# Patient Record
Sex: Male | Born: 1975
Health system: Southern US, Community
[De-identification: ages and names within clinical notes are randomized; demographics above are authoritative.]

## PROBLEM LIST (undated history)

## (undated) DIAGNOSIS — G4730 Sleep apnea, unspecified: Secondary | ICD-10-CM

## (undated) DIAGNOSIS — K469 Unspecified abdominal hernia without obstruction or gangrene: Secondary | ICD-10-CM

## (undated) DIAGNOSIS — R42 Dizziness and giddiness: Secondary | ICD-10-CM

## (undated) DIAGNOSIS — G43909 Migraine, unspecified, not intractable, without status migrainosus: Secondary | ICD-10-CM

## (undated) DIAGNOSIS — K5792 Diverticulitis of intestine, part unspecified, without perforation or abscess without bleeding: Secondary | ICD-10-CM

## (undated) DIAGNOSIS — Z86718 Personal history of other venous thrombosis and embolism: Secondary | ICD-10-CM

## (undated) DIAGNOSIS — K219 Gastro-esophageal reflux disease without esophagitis: Secondary | ICD-10-CM

## (undated) DIAGNOSIS — J45909 Unspecified asthma, uncomplicated: Secondary | ICD-10-CM

## (undated) DIAGNOSIS — M1990 Unspecified osteoarthritis, unspecified site: Secondary | ICD-10-CM

## (undated) HISTORY — DX: Gastro-esophageal reflux disease without esophagitis: K21.9

## (undated) HISTORY — DX: Personal history of other venous thrombosis and embolism: Z86.718

## (undated) HISTORY — DX: Migraine, unspecified, not intractable, without status migrainosus: G43.909

## (undated) HISTORY — DX: Unspecified asthma, uncomplicated: J45.909

## (undated) HISTORY — DX: Dizziness and giddiness: R42

---

## 2010-11-12 HISTORY — PX: KNEE ARTHROSCOPY: SHX127

## 2016-02-28 DIAGNOSIS — K219 Gastro-esophageal reflux disease without esophagitis: Secondary | ICD-10-CM | POA: Insufficient documentation

## 2016-02-28 DIAGNOSIS — K5909 Other constipation: Secondary | ICD-10-CM | POA: Insufficient documentation

## 2016-02-28 DIAGNOSIS — M170 Bilateral primary osteoarthritis of knee: Secondary | ICD-10-CM | POA: Insufficient documentation

## 2016-02-28 DIAGNOSIS — Z6841 Body Mass Index (BMI) 40.0 and over, adult: Secondary | ICD-10-CM

## 2017-01-26 DIAGNOSIS — K5732 Diverticulitis of large intestine without perforation or abscess without bleeding: Secondary | ICD-10-CM | POA: Diagnosis not present

## 2017-06-30 DIAGNOSIS — M25569 Pain in unspecified knee: Secondary | ICD-10-CM | POA: Diagnosis not present

## 2017-09-15 DIAGNOSIS — K5732 Diverticulitis of large intestine without perforation or abscess without bleeding: Secondary | ICD-10-CM | POA: Diagnosis not present

## 2017-11-28 ENCOUNTER — Observation Stay (HOSPITAL_COMMUNITY)
Admission: EM | Admit: 2017-11-28 | Discharge: 2017-11-30 | Disposition: A | Discharge status: Home / Self Care | Payer: BLUE CROSS/BLUE SHIELD | Attending: General Surgery | Admitting: General Surgery

## 2017-11-28 ENCOUNTER — Encounter (HOSPITAL_COMMUNITY): Payer: Self-pay | Admitting: Emergency Medicine

## 2017-11-28 ENCOUNTER — Other Ambulatory Visit: Payer: Self-pay

## 2017-11-28 DIAGNOSIS — R1110 Vomiting, unspecified: Secondary | ICD-10-CM | POA: Diagnosis not present

## 2017-11-28 DIAGNOSIS — Z6839 Body mass index (BMI) 39.0-39.9, adult: Secondary | ICD-10-CM | POA: Insufficient documentation

## 2017-11-28 DIAGNOSIS — K460 Unspecified abdominal hernia with obstruction, without gangrene: Secondary | ICD-10-CM | POA: Diagnosis not present

## 2017-11-28 DIAGNOSIS — K429 Umbilical hernia without obstruction or gangrene: Secondary | ICD-10-CM | POA: Diagnosis present

## 2017-11-28 DIAGNOSIS — Z91030 Bee allergy status: Secondary | ICD-10-CM | POA: Insufficient documentation

## 2017-11-28 DIAGNOSIS — M170 Bilateral primary osteoarthritis of knee: Secondary | ICD-10-CM | POA: Insufficient documentation

## 2017-11-28 DIAGNOSIS — E669 Obesity, unspecified: Secondary | ICD-10-CM | POA: Insufficient documentation

## 2017-11-28 DIAGNOSIS — Z9989 Dependence on other enabling machines and devices: Secondary | ICD-10-CM | POA: Insufficient documentation

## 2017-11-28 DIAGNOSIS — K420 Umbilical hernia with obstruction, without gangrene: Principal | ICD-10-CM | POA: Diagnosis present

## 2017-11-28 DIAGNOSIS — Z79899 Other long term (current) drug therapy: Secondary | ICD-10-CM | POA: Diagnosis not present

## 2017-11-28 DIAGNOSIS — R1033 Periumbilical pain: Secondary | ICD-10-CM | POA: Diagnosis not present

## 2017-11-28 DIAGNOSIS — K469 Unspecified abdominal hernia without obstruction or gangrene: Secondary | ICD-10-CM

## 2017-11-28 DIAGNOSIS — Z8719 Personal history of other diseases of the digestive system: Secondary | ICD-10-CM | POA: Diagnosis not present

## 2017-11-28 DIAGNOSIS — G4730 Sleep apnea, unspecified: Secondary | ICD-10-CM | POA: Diagnosis not present

## 2017-11-28 HISTORY — DX: Unspecified osteoarthritis, unspecified site: M19.90

## 2017-11-28 HISTORY — DX: Diverticulitis of intestine, part unspecified, without perforation or abscess without bleeding: K57.92

## 2017-11-28 HISTORY — DX: Unspecified abdominal hernia without obstruction or gangrene: K46.9

## 2017-11-28 HISTORY — DX: Sleep apnea, unspecified: G47.30

## 2017-11-28 LAB — COMPREHENSIVE METABOLIC PANEL
ALBUMIN: 4 g/dL (ref 3.5–5.0)
ALK PHOS: 73 U/L (ref 38–126)
ALT: 25 U/L (ref 17–63)
ANION GAP: 9 (ref 5–15)
AST: 21 U/L (ref 15–41)
BUN: 10 mg/dL (ref 6–20)
CALCIUM: 9 mg/dL (ref 8.9–10.3)
CO2: 25 mmol/L (ref 22–32)
Chloride: 104 mmol/L (ref 101–111)
Creatinine, Ser: 0.93 mg/dL (ref 0.61–1.24)
GFR calc non Af Amer: >60 mL/min (ref >60)
GLUCOSE: 110 mg/dL — AB (ref 65–99)
POTASSIUM: 4.2 mmol/L (ref 3.5–5.1)
SODIUM: 138 mmol/L (ref 135–145)
Total Bilirubin: 0.5 mg/dL (ref 0.3–1.2)
Total Protein: 7.3 g/dL (ref 6.5–8.1)

## 2017-11-28 LAB — CBC
HEMATOCRIT: 43.7 % (ref 39.0–52.0)
HEMOGLOBIN: 14.2 g/dL (ref 13.0–17.0)
MCH: 27.3 pg (ref 26.0–34.0)
MCHC: 32.5 g/dL (ref 30.0–36.0)
MCV: 83.9 fL (ref 78.0–100.0)
Platelets: 290 10*3/uL (ref 150–400)
RBC: 5.21 MIL/uL (ref 4.22–5.81)
RDW: 13.4 % (ref 11.5–15.5)
WBC: 8.5 10*3/uL (ref 4.0–10.5)

## 2017-11-28 LAB — URINALYSIS, ROUTINE W REFLEX MICROSCOPIC
Bilirubin Urine: NEGATIVE
Glucose, UA: NEGATIVE mg/dL
Hgb urine dipstick: NEGATIVE
Ketones, ur: NEGATIVE mg/dL
Leukocytes, UA: NEGATIVE
Nitrite: NEGATIVE
PH: 5 (ref 5.0–8.0)
Protein, ur: NEGATIVE mg/dL
SPECIFIC GRAVITY, URINE: 1.013 (ref 1.005–1.030)

## 2017-11-28 LAB — LIPASE, BLOOD: Lipase: 31 U/L (ref 11–51)

## 2017-11-28 MED ORDER — MORPHINE SULFATE (PF) 4 MG/ML IV SOLN
4.0000 mg | Freq: Once | INTRAVENOUS | Status: AC
  Administered 2017-11-29: 4 mg via INTRAVENOUS
  Filled 2017-11-28: qty 1

## 2017-11-28 MED ORDER — SODIUM CHLORIDE 0.9 % IV BOLUS (SEPSIS)
1000.0000 mL | Freq: Once | INTRAVENOUS | Status: AC
  Administered 2017-11-29: 1000 mL via INTRAVENOUS

## 2017-11-28 NOTE — ED Triage Notes (Signed)
Pt reports hx of abd hernia that he has had for a while, states he normally pushes hernia back in but recently is has popped out and he was not able to push it back in. Pt reports nausea and vomiting since the beginning of the week.

## 2017-11-29 ENCOUNTER — Emergency Department (HOSPITAL_COMMUNITY): Payer: BLUE CROSS/BLUE SHIELD | Admitting: Certified Registered"

## 2017-11-29 ENCOUNTER — Emergency Department (HOSPITAL_COMMUNITY): Payer: BLUE CROSS/BLUE SHIELD

## 2017-11-29 ENCOUNTER — Encounter (HOSPITAL_COMMUNITY): Payer: Self-pay | Admitting: Certified Registered"

## 2017-11-29 ENCOUNTER — Encounter (HOSPITAL_COMMUNITY)
Admission: EM | Disposition: A | Discharge status: Home / Self Care | Payer: Self-pay | Source: Home / Self Care | Attending: Emergency Medicine

## 2017-11-29 DIAGNOSIS — K420 Umbilical hernia with obstruction, without gangrene: Secondary | ICD-10-CM | POA: Diagnosis not present

## 2017-11-29 DIAGNOSIS — M1990 Unspecified osteoarthritis, unspecified site: Secondary | ICD-10-CM | POA: Diagnosis not present

## 2017-11-29 DIAGNOSIS — R1110 Vomiting, unspecified: Secondary | ICD-10-CM | POA: Diagnosis not present

## 2017-11-29 DIAGNOSIS — K429 Umbilical hernia without obstruction or gangrene: Secondary | ICD-10-CM | POA: Diagnosis present

## 2017-11-29 HISTORY — PX: UMBILICAL HERNIA REPAIR: SHX196

## 2017-11-29 LAB — CBC
HEMATOCRIT: 44.2 % (ref 39.0–52.0)
Hemoglobin: 14.3 g/dL (ref 13.0–17.0)
MCH: 27.5 pg (ref 26.0–34.0)
MCHC: 32.4 g/dL (ref 30.0–36.0)
MCV: 85 fL (ref 78.0–100.0)
Platelets: 279 10*3/uL (ref 150–400)
RBC: 5.2 MIL/uL (ref 4.22–5.81)
RDW: 13.7 % (ref 11.5–15.5)
WBC: 13.2 10*3/uL — ABNORMAL HIGH (ref 4.0–10.5)

## 2017-11-29 LAB — I-STAT CG4 LACTIC ACID, ED
Lactic Acid, Venous: 0.97 mmol/L (ref 0.5–1.9)
Lactic Acid, Venous: 1.18 mmol/L (ref 0.5–1.9)

## 2017-11-29 LAB — CREATININE, SERUM
Creatinine, Ser: 0.94 mg/dL (ref 0.61–1.24)
GFR calc Af Amer: >60 mL/min (ref >60)
GFR calc non Af Amer: >60 mL/min (ref >60)

## 2017-11-29 SURGERY — REPAIR, HERNIA, UMBILICAL, ADULT
Anesthesia: General | Site: Abdomen

## 2017-11-29 MED ORDER — DOCUSATE SODIUM 100 MG PO CAPS
100.0000 mg | ORAL_CAPSULE | Freq: Two times a day (BID) | ORAL | Status: DC
  Administered 2017-11-29 – 2017-11-30 (×3): 100 mg via ORAL
  Filled 2017-11-29 (×3): qty 1

## 2017-11-29 MED ORDER — LACTATED RINGERS IV SOLN
INTRAVENOUS | Status: DC | PRN
  Administered 2017-11-29 (×2): via INTRAVENOUS

## 2017-11-29 MED ORDER — LIDOCAINE 2% (20 MG/ML) 5 ML SYRINGE
INTRAMUSCULAR | Status: AC
  Filled 2017-11-29: qty 5

## 2017-11-29 MED ORDER — OXYCODONE HCL 5 MG PO TABS
5.0000 mg | ORAL_TABLET | Freq: Once | ORAL | Status: AC | PRN
  Administered 2017-11-29: 5 mg via ORAL

## 2017-11-29 MED ORDER — POLYETHYLENE GLYCOL 3350 17 G PO PACK: 17.0000 g | PACK | Freq: Every day | ORAL | Status: DC | PRN

## 2017-11-29 MED ORDER — SIMETHICONE 80 MG PO CHEW: 40.0000 mg | CHEWABLE_TABLET | Freq: Four times a day (QID) | ORAL | Status: DC | PRN

## 2017-11-29 MED ORDER — GABAPENTIN 300 MG PO CAPS
300.0000 mg | ORAL_CAPSULE | Freq: Two times a day (BID) | ORAL | Status: DC
  Administered 2017-11-29 – 2017-11-30 (×3): 300 mg via ORAL
  Filled 2017-11-29 (×3): qty 1

## 2017-11-29 MED ORDER — MORPHINE SULFATE (PF) 4 MG/ML IV SOLN
2.0000 mg | INTRAVENOUS | Status: DC | PRN
  Administered 2017-11-29 – 2017-11-30 (×2): 2 mg via INTRAVENOUS
  Filled 2017-11-29 (×2): qty 1

## 2017-11-29 MED ORDER — HYDROCODONE-ACETAMINOPHEN 5-325 MG PO TABS: 1.0000 | ORAL_TABLET | ORAL | Status: DC | PRN

## 2017-11-29 MED ORDER — ACETAMINOPHEN 325 MG PO TABS
650.0000 mg | ORAL_TABLET | Freq: Four times a day (QID) | ORAL | Status: DC
  Administered 2017-11-29 – 2017-11-30 (×4): 650 mg via ORAL
  Filled 2017-11-29 (×5): qty 2

## 2017-11-29 MED ORDER — LIDOCAINE HCL (CARDIAC) 20 MG/ML IV SOLN
INTRAVENOUS | Status: DC | PRN
  Administered 2017-11-29: 100 mg via INTRATRACHEAL

## 2017-11-29 MED ORDER — PROPOFOL 10 MG/ML IV BOLUS
INTRAVENOUS | Status: DC | PRN
  Administered 2017-11-29: 30 mg via INTRAVENOUS
  Administered 2017-11-29: 10 mg via INTRAVENOUS
  Administered 2017-11-29: 200 mg via INTRAVENOUS

## 2017-11-29 MED ORDER — KETOROLAC TROMETHAMINE 30 MG/ML IJ SOLN
30.0000 mg | Freq: Four times a day (QID) | INTRAMUSCULAR | Status: DC
  Administered 2017-11-29 – 2017-11-30 (×5): 30 mg via INTRAVENOUS
  Filled 2017-11-29 (×6): qty 1

## 2017-11-29 MED ORDER — IOPAMIDOL (ISOVUE-300) INJECTION 61%
INTRAVENOUS | Status: AC
  Administered 2017-11-29: 100 mL
  Filled 2017-11-29: qty 100

## 2017-11-29 MED ORDER — SUGAMMADEX SODIUM 200 MG/2ML IV SOLN
INTRAVENOUS | Status: AC
  Filled 2017-11-29: qty 2

## 2017-11-29 MED ORDER — BUPIVACAINE HCL (PF) 0.25 % IJ SOLN
INTRAMUSCULAR | Status: AC
  Filled 2017-11-29: qty 30

## 2017-11-29 MED ORDER — BUPIVACAINE HCL (PF) 0.25 % IJ SOLN
INTRAMUSCULAR | Status: DC | PRN
  Administered 2017-11-29: 30 mL

## 2017-11-29 MED ORDER — MIDAZOLAM HCL 2 MG/2ML IJ SOLN
INTRAMUSCULAR | Status: AC
  Filled 2017-11-29: qty 2

## 2017-11-29 MED ORDER — MORPHINE SULFATE (PF) 4 MG/ML IV SOLN
4.0000 mg | Freq: Once | INTRAVENOUS | Status: AC
  Administered 2017-11-29: 4 mg via INTRAVENOUS
  Filled 2017-11-29: qty 1

## 2017-11-29 MED ORDER — FENTANYL CITRATE (PF) 100 MCG/2ML IJ SOLN
INTRAMUSCULAR | Status: AC
  Filled 2017-11-29: qty 2

## 2017-11-29 MED ORDER — ONDANSETRON HCL 4 MG/2ML IJ SOLN: 4.0000 mg | Freq: Four times a day (QID) | INTRAMUSCULAR | Status: DC | PRN

## 2017-11-29 MED ORDER — DEXAMETHASONE SODIUM PHOSPHATE 10 MG/ML IJ SOLN
INTRAMUSCULAR | Status: DC | PRN
  Administered 2017-11-29: 10 mg via INTRAVENOUS

## 2017-11-29 MED ORDER — KETOROLAC TROMETHAMINE 30 MG/ML IJ SOLN: 30.0000 mg | Freq: Four times a day (QID) | INTRAMUSCULAR | Status: DC | PRN

## 2017-11-29 MED ORDER — HYDRALAZINE HCL 20 MG/ML IJ SOLN: 10.0000 mg | INTRAMUSCULAR | Status: DC | PRN

## 2017-11-29 MED ORDER — SUCCINYLCHOLINE CHLORIDE 20 MG/ML IJ SOLN
INTRAMUSCULAR | Status: DC | PRN
  Administered 2017-11-29: 140 mg via INTRAVENOUS

## 2017-11-29 MED ORDER — OXYCODONE HCL 5 MG/5ML PO SOLN: 5.0000 mg | Freq: Once | ORAL | Status: AC | PRN

## 2017-11-29 MED ORDER — DEXAMETHASONE SODIUM PHOSPHATE 10 MG/ML IJ SOLN
INTRAMUSCULAR | Status: AC
  Filled 2017-11-29: qty 1

## 2017-11-29 MED ORDER — OXYCODONE HCL 5 MG PO TABS
5.0000 mg | ORAL_TABLET | ORAL | Status: DC | PRN
  Administered 2017-11-30: 10 mg via ORAL
  Filled 2017-11-29: qty 2

## 2017-11-29 MED ORDER — FENTANYL CITRATE (PF) 100 MCG/2ML IJ SOLN
25.0000 ug | INTRAMUSCULAR | Status: DC | PRN
  Administered 2017-11-29 (×2): 50 ug via INTRAVENOUS

## 2017-11-29 MED ORDER — MIDAZOLAM HCL 5 MG/5ML IJ SOLN
INTRAMUSCULAR | Status: DC | PRN
  Administered 2017-11-29: 2 mg via INTRAVENOUS

## 2017-11-29 MED ORDER — PROPOFOL 10 MG/ML IV BOLUS
INTRAVENOUS | Status: AC
  Filled 2017-11-29: qty 20

## 2017-11-29 MED ORDER — ROCURONIUM BROMIDE 10 MG/ML (PF) SYRINGE
PREFILLED_SYRINGE | INTRAVENOUS | Status: AC
  Filled 2017-11-29: qty 5

## 2017-11-29 MED ORDER — ROCURONIUM BROMIDE 100 MG/10ML IV SOLN
INTRAVENOUS | Status: DC | PRN
  Administered 2017-11-29: 10 mg via INTRAVENOUS
  Administered 2017-11-29: 40 mg via INTRAVENOUS
  Administered 2017-11-29: 20 mg via INTRAVENOUS

## 2017-11-29 MED ORDER — ENOXAPARIN SODIUM 40 MG/0.4ML ~~LOC~~ SOLN
40.0000 mg | SUBCUTANEOUS | Status: DC
  Administered 2017-11-30: 40 mg via SUBCUTANEOUS
  Filled 2017-11-29: qty 0.4

## 2017-11-29 MED ORDER — TRAMADOL HCL 50 MG PO TABS
50.0000 mg | ORAL_TABLET | Freq: Four times a day (QID) | ORAL | Status: DC | PRN
  Administered 2017-11-29: 50 mg via ORAL
  Filled 2017-11-29: qty 1

## 2017-11-29 MED ORDER — PROMETHAZINE HCL 25 MG/ML IJ SOLN: 6.2500 mg | INTRAMUSCULAR | Status: DC | PRN

## 2017-11-29 MED ORDER — CEFAZOLIN SODIUM 1 G IJ SOLR
INTRAMUSCULAR | Status: AC
  Filled 2017-11-29: qty 20

## 2017-11-29 MED ORDER — SUFENTANIL CITRATE 50 MCG/ML IV SOLN
INTRAVENOUS | Status: DC | PRN
  Administered 2017-11-29 (×2): 10 ug via INTRAVENOUS
  Administered 2017-11-29: 5 ug via INTRAVENOUS

## 2017-11-29 MED ORDER — ENSURE SURGERY PO LIQD
237.0000 mL | Freq: Two times a day (BID) | ORAL | Status: DC
  Administered 2017-11-30 (×2): 237 mL via ORAL
  Filled 2017-11-29 (×4): qty 237

## 2017-11-29 MED ORDER — ACETAMINOPHEN 325 MG PO TABS: 650.0000 mg | ORAL_TABLET | Freq: Four times a day (QID) | ORAL | Status: DC | PRN

## 2017-11-29 MED ORDER — ACETAMINOPHEN 650 MG RE SUPP: 650.0000 mg | Freq: Four times a day (QID) | RECTAL | Status: DC | PRN

## 2017-11-29 MED ORDER — ONDANSETRON 4 MG PO TBDP
4.0000 mg | ORAL_TABLET | Freq: Four times a day (QID) | ORAL | Status: DC | PRN
  Filled 2017-11-29: qty 1

## 2017-11-29 MED ORDER — 0.9 % SODIUM CHLORIDE (POUR BTL) OPTIME
TOPICAL | Status: DC | PRN
  Administered 2017-11-29: 1000 mL

## 2017-11-29 MED ORDER — ONDANSETRON HCL 4 MG/2ML IJ SOLN
INTRAMUSCULAR | Status: AC
  Filled 2017-11-29: qty 2

## 2017-11-29 MED ORDER — SODIUM CHLORIDE 0.9 % IV SOLN
INTRAVENOUS | Status: DC
  Administered 2017-11-29 (×2): via INTRAVENOUS

## 2017-11-29 MED ORDER — SODIUM CHLORIDE 0.9 % IJ SOLN
INTRAMUSCULAR | Status: AC
  Filled 2017-11-29: qty 10

## 2017-11-29 MED ORDER — OXYCODONE HCL 5 MG PO TABS
ORAL_TABLET | ORAL | Status: AC
  Filled 2017-11-29: qty 1

## 2017-11-29 MED ORDER — ONDANSETRON HCL 4 MG/2ML IJ SOLN
INTRAMUSCULAR | Status: DC | PRN
  Administered 2017-11-29: 4 mg via INTRAVENOUS

## 2017-11-29 MED ORDER — EPHEDRINE SULFATE 50 MG/ML IJ SOLN
INTRAMUSCULAR | Status: DC | PRN
  Administered 2017-11-29: 5 mg via INTRAVENOUS

## 2017-11-29 MED ORDER — SUFENTANIL CITRATE 50 MCG/ML IV SOLN
INTRAVENOUS | Status: AC
  Filled 2017-11-29: qty 1

## 2017-11-29 MED ORDER — CEFAZOLIN SODIUM-DEXTROSE 2-4 GM/100ML-% IV SOLN
2.0000 g | INTRAVENOUS | Status: AC
  Administered 2017-11-29: 2 g via INTRAVENOUS

## 2017-11-29 MED ORDER — EPHEDRINE 5 MG/ML INJ
INTRAVENOUS | Status: AC
  Filled 2017-11-29: qty 10

## 2017-11-29 MED ORDER — SUGAMMADEX SODIUM 200 MG/2ML IV SOLN
INTRAVENOUS | Status: DC | PRN
  Administered 2017-11-29: 200 mg via INTRAVENOUS

## 2017-11-29 MED ORDER — SUCCINYLCHOLINE CHLORIDE 200 MG/10ML IV SOSY
PREFILLED_SYRINGE | INTRAVENOUS | Status: AC
  Filled 2017-11-29: qty 10

## 2017-11-29 MED ORDER — BUPIVACAINE-EPINEPHRINE (PF) 0.5% -1:200000 IJ SOLN
INTRAMUSCULAR | Status: AC
  Filled 2017-11-29: qty 30

## 2017-11-29 SURGICAL SUPPLY — 33 items
BLADE CLIPPER SURG (BLADE) ×2 IMPLANT
CANISTER SUCT 3000ML PPV (MISCELLANEOUS) ×2 IMPLANT
CHLORAPREP W/TINT 26ML (MISCELLANEOUS) ×2 IMPLANT
COVER SURGICAL LIGHT HANDLE (MISCELLANEOUS) ×2 IMPLANT
DERMABOND ADVANCED (GAUZE/BANDAGES/DRESSINGS) ×1
DERMABOND ADVANCED .7 DNX12 (GAUZE/BANDAGES/DRESSINGS) ×1 IMPLANT
DRAPE LAPAROTOMY 100X72 PEDS (DRAPES) ×2 IMPLANT
ELECT REM PT RETURN 9FT ADLT (ELECTROSURGICAL) ×2
ELECTRODE REM PT RTRN 9FT ADLT (ELECTROSURGICAL) ×1 IMPLANT
GLOVE BIOGEL PI IND STRL 7.0 (GLOVE) ×1 IMPLANT
GLOVE BIOGEL PI INDICATOR 7.0 (GLOVE) ×1
GLOVE SURG SS PI 7.0 STRL IVOR (GLOVE) ×2 IMPLANT
GOWN STRL REUS W/ TWL LRG LVL3 (GOWN DISPOSABLE) ×2 IMPLANT
GOWN STRL REUS W/TWL LRG LVL3 (GOWN DISPOSABLE) ×2
KIT BASIN OR (CUSTOM PROCEDURE TRAY) ×2 IMPLANT
KIT ROOM TURNOVER OR (KITS) ×2 IMPLANT
MESH VENTRALEX ST 2.5 CRC MED (Mesh General) ×2 IMPLANT
NEEDLE HYPO 25GX1X1/2 BEV (NEEDLE) ×2 IMPLANT
NS IRRIG 1000ML POUR BTL (IV SOLUTION) ×2 IMPLANT
PACK GENERAL/GYN (CUSTOM PROCEDURE TRAY) ×2 IMPLANT
PAD ARMBOARD 7.5X6 YLW CONV (MISCELLANEOUS) ×2 IMPLANT
SUT MNCRL AB 4-0 PS2 18 (SUTURE) ×2 IMPLANT
SUT MON AB 3-0 SH 27 (SUTURE) ×1
SUT MON AB 3-0 SH27 (SUTURE) ×1 IMPLANT
SUT PROLENE 0 CT 1 30 (SUTURE) ×10 IMPLANT
SUT SILK 2 0 SH (SUTURE) ×4 IMPLANT
SUT SILK 2 0 TIES 10X30 (SUTURE) ×2 IMPLANT
SUT SILK 3 0 TIES 10X30 (SUTURE) ×2 IMPLANT
SUT VIC AB 3-0 SH 27 (SUTURE) ×2
SUT VIC AB 3-0 SH 27XBRD (SUTURE) ×2 IMPLANT
SYR CONTROL 10ML LL (SYRINGE) ×2 IMPLANT
TOWEL OR 17X24 6PK STRL BLUE (TOWEL DISPOSABLE) ×2 IMPLANT
TOWEL OR 17X26 10 PK STRL BLUE (TOWEL DISPOSABLE) ×2 IMPLANT

## 2017-11-29 NOTE — Op Note (Signed)
PATIENT:  Marc May  42 y.o. male  PRE-OPERATIVE DIAGNOSIS:  Incarcerated umbilical Hernia  POST-OPERATIVE DIAGNOSIS:  Strangulated umbilical Hernia  PROCEDURE:  Procedure(s): HERNIA REPAIR UMBILICAL ADULT with mesh   SURGEON:  Surgeon(s): Britlyn Martine, Arta Bruce, MD  ASSISTANT: none   ANESTHESIA:   local and general  Indications for procedure: [name] is a 42 y.o. year old male with symptoms of abdominal pain, nausea and vomiting came to the ER and was found to have an incarcerated umbilical hernia.  Description of procedure: The patient was brought into the operative suite. Anesthesia was administered with General endotracheal anesthesia. WHO checklist was applied. The patient was then placed in supine. The area was prepped and draped in the usual sterile fashion.  Next the infraumbilical skin was anesthetized with marcaine. A semilunar infraumbilical incision was made. Cautery and blunt dissection was used to dissect down to the fascia. The hernia sac was dissected free from surrounding tissues in 360 degrees. The umbilical skin was dissected free of the hernia sac with cautery. The sac was incised and a large amount of omentum was seen within the sac. There was an area of the omentum where fat nodules appeared inflamed or ischemic. This portion of the omentum was removed with cautery and 2 2-0 silk stick ties were used for hemostasis. The contents of the hernia sac were reduced. The hernia defect was 3 cm in diameter. The hernia sac was removed. Due to the size of the hernia, a 4.3 cm ventralex mesh was placed as an underlay using 0 prolene in 4 directions to anchor the mesh. The fascial defect was then primarily closed with interrupted 0 prolene sutures. The umbilical skin was sutured to the fascia with a 3-0 vicryl. The deep dermal space was closed with a 3-0 vicryl. marcaine was injected into the muscle layer and around the fascia. The skin was closed with a 4-0 monocryl  subcuticular suture. Dermabond was put in place for dressing. The patient awoke from anesthesia and was brought to pacu in stable condition. All counts were correct.  Findings: incarcerated umbilical hernia with strangulation of the omentum  Specimen: none  Blood loss: Total I/O In: 2000 [I.V.:1000; IV Piggyback:1000] Out: 100 [Blood:100] ml  Local anesthesia: 53GD marcaine  Complications: none  PLAN OF CARE: Admit to inpatient   PATIENT DISPOSITION:  PACU - hemodynamically stable.  Gurney Maxin, M.D. General, Bariatric, & Minimally Invasive Surgery [facility], [place]  11/29/2017 5:44 AM

## 2017-11-29 NOTE — Transfer of Care (Signed)
Immediate Anesthesia Transfer of Care Note  Patient: Marc May  Procedure(s) Performed: HERNIA REPAIR UMBILICAL ADULT (N/A Abdomen)  Patient Location: PACU  Anesthesia Type:General  Level of Consciousness: oriented, sedated, drowsy and responds to stimulation  Airway & Oxygen Therapy: Patient Spontanous Breathing and Patient connected to nasal cannula oxygen  Post-op Assessment: Report given to RN and Patient moving all extremities X 4  Post vital signs: Reviewed and stable  Last Vitals:  Vitals:   11/29/17 0339 11/29/17 0606  BP: 132/69 129/81  Pulse: 72 80  Resp: 14 13  Temp: 36.8 C 36.8 C  SpO2: 97% 97%    Last Pain:  Vitals:   11/29/17 0339  TempSrc: Oral  PainSc:          Complications: No apparent anesthesia complications

## 2017-11-29 NOTE — Anesthesia Procedure Notes (Signed)
Procedure Name: Intubation Date/Time: 11/29/2017 4:20 AM Performed by: Claris Che, CRNA Pre-anesthesia Checklist: Patient identified, Emergency Drugs available, Suction available, Patient being monitored and Timeout performed Patient Re-evaluated:Patient Re-evaluated prior to induction Oxygen Delivery Method: Circle system utilized Preoxygenation: Pre-oxygenation with 100% oxygen Induction Type: IV induction, Rapid sequence and Cricoid Pressure applied Ventilation: Mask ventilation without difficulty Laryngoscope Size: Mac and 4 Grade View: Grade III Tube type: Oral Tube size: 8.0 mm Number of attempts: 1 Airway Equipment and Method: Stylet Placement Confirmation: ETT inserted through vocal cords under direct vision,  positive ETCO2 and breath sounds checked- equal and bilateral Secured at: 23 cm Tube secured with: Tape Dental Injury: Teeth and Oropharynx as per pre-operative assessment

## 2017-11-29 NOTE — Anesthesia Preprocedure Evaluation (Addendum)
Anesthesia Evaluation  Patient identified by MRN, date of birth, ID band Patient awake    Reviewed: Allergy & Precautions, NPO status , Patient's Chart, lab work & pertinent test results  Airway Mallampati: III  TM Distance: >3 FB Neck ROM: Full    Dental  (+) Dental Advisory Given, Teeth Intact   Pulmonary sleep apnea and Continuous Positive Airway Pressure Ventilation ,    Pulmonary exam normal breath sounds clear to auscultation       Cardiovascular negative cardio ROS Normal cardiovascular exam Rhythm:Regular Rate:Normal     Neuro/Psych negative neurological ROS  negative psych ROS   GI/Hepatic negative GI ROS, Neg liver ROS,   Endo/Other  Obesity  Renal/GU negative Renal ROS  negative genitourinary   Musculoskeletal  (+) Arthritis , Osteoarthritis,    Abdominal   Peds  Hematology negative hematology ROS (+)   Anesthesia Other Findings   Reproductive/Obstetrics                            Anesthesia Physical Anesthesia Plan  ASA: II and emergent  Anesthesia Plan: General   Post-op Pain Management:    Induction: Intravenous  PONV Risk Score and Plan: 3 and Treatment may vary due to age or medical condition, Ondansetron, Dexamethasone and Midazolam  Airway Management Planned: Oral ETT  Additional Equipment: None  Intra-op Plan:   Post-operative Plan: Extubation in OR  Informed Consent: I have reviewed the patients History and Physical, chart, labs and discussed the procedure including the risks, benefits and alternatives for the proposed anesthesia with the patient or authorized representative who has indicated his/her understanding and acceptance.   Dental advisory given  Plan Discussed with: CRNA and Surgeon  Anesthesia Plan Comments:       Anesthesia Quick Evaluation

## 2017-11-29 NOTE — ED Notes (Signed)
ED Provider at bedside. 

## 2017-11-29 NOTE — Progress Notes (Signed)
[facility] Progress Note  Day of Surgery  Subjective: CC: pain Patient complaining of pain, especially with trying to stand. Discussed available pain medications and trying to control pain with PO medications, patient agreeable. Denies n/v. Tolerating clears. No flatus yet.   Objective: Vital signs in last 24 hours: Temp:  [97.7 F (36.5 C)-98.8 F (37.1 C)] 98 F (36.7 C) (01/18 0820) Pulse Rate:  [68-85] 72 (01/18 0923) Resp:  [13-18] 16 (01/18 0923) BP: (119-150)/(65-107) 132/76 (01/18 0820) SpO2:  [94 %-100 %] 94 % (01/18 0923) Weight:  [120.2 kg (265 lb)] 120.2 kg (265 lb) (01/18 0349)    Intake/Output from previous day: 01/17 0701 - 01/18 0700 In: 2600 [I.V.:1600; IV Piggyback:1000] Out: 100 [Blood:100] Intake/Output this shift: Total I/O In: -  Out: 825 [Urine:825]  PE: Gen:  Alert, NAD, pleasant Card:  Regular rate and rhythm, pedal pulses 2+ BL Pulm:  Normal effort, clear to auscultation bilaterally Abd: Soft, appropriately tender, mildly distended, +bowel sounds, no HSM, incisions C/D/I Skin: warm and dry, no rashes  Psych: A&Ox3   Lab Results:  Recent Labs    11/28/17 1710 11/29/17 0902  WBC 8.5 13.2*  HGB 14.2 14.3  HCT 43.7 44.2  PLT 290 279   BMET Recent Labs    11/28/17 1710 11/29/17 0902  NA 138  --   K 4.2  --   CL 104  --   CO2 25  --   GLUCOSE 110*  --   BUN 10  --   CREATININE 0.93 0.94  CALCIUM 9.0  --    PT/INR No results for input(s): LABPROT, INR in the last 72 hours. CMP     Component Value Date/Time   NA 138 11/28/2017 1710   K 4.2 11/28/2017 1710   CL 104 11/28/2017 1710   CO2 25 11/28/2017 1710   GLUCOSE 110 (H) 11/28/2017 1710   BUN 10 11/28/2017 1710   CREATININE 0.94 11/29/2017 0902   CALCIUM 9.0 11/28/2017 1710   PROT 7.3 11/28/2017 1710   ALBUMIN 4.0 11/28/2017 1710   AST 21 11/28/2017 1710   ALT 25 11/28/2017 1710   ALKPHOS 73 11/28/2017 1710   BILITOT 0.5 11/28/2017 1710   GFRNONAA >60  11/29/2017 0902   GFRAA >60 11/29/2017 0902   Lipase     Component Value Date/Time   LIPASE 31 11/28/2017 1710       Studies/Results: Ct Abdomen Pelvis W Contrast  Result Date: 11/29/2017 CLINICAL DATA:  42 year old male with abdominal hernia. Abdominal pain, nausea vomiting. EXAM: CT ABDOMEN AND PELVIS WITH CONTRAST TECHNIQUE: Multidetector CT imaging of the abdomen and pelvis was performed using the standard protocol following bolus administration of intravenous contrast. CONTRAST:  148mL ISOVUE-300 IOPAMIDOL (ISOVUE-300) INJECTION 61% COMPARISON:  None. FINDINGS: Lower chest: The visualized lung bases are clear. No intra-free air or free fluid Hepatobiliary: Probable mild fatty liver. No intrahepatic biliary ductal dilatation. The gallbladder is unremarkable. Pancreas: Unremarkable. No pancreatic ductal dilatation or surrounding inflammatory changes. Spleen: Normal in size without focal abnormality. Adrenals/Urinary Tract: Adrenal glands are unremarkable. Kidneys are normal, without renal calculi, focal lesion, or hydronephrosis. Bladder is unremarkable. Stomach/Bowel: There is sigmoid diverticulosis there is thickening of a segment of the sigmoid colon most consistent with muscular hypertrophy related to chronic inflammation. Underlying mass is not entirely excluded. There is no active inflammatory changes. There is no bowel obstruction or active inflammation. Normal appendix. Vascular/Lymphatic: No significant vascular findings are present. No enlarged abdominal or pelvic lymph nodes. Reproductive:  The prostate and seminal vesicles are grossly unremarkable. Other: There is a 3.5 cm peritoneal defect at the level of the umbilicus with herniation of the omental fat. There is diffuse stranding and inflammatory changes of the herniated fat most consistent with strangulation likely secondary to incarceration. Clinical correlation is recommended. No fluid collection or abscess. Musculoskeletal: Mild  degenerative changes of the lower lumbar spine. No acute osseous pathology. IMPRESSION: 1. Fat containing umbilical hernia with inflammatory changes most consistent with strangulation and suggestive of incarceration. No fluid collection or abscess. 2. Sigmoid diverticulosis with muscular hypertrophy. Underlying sigmoid mass is less likely. No bowel obstruction. Normal appendix. Electronically Signed   By: Anner Crete M.D.   On: 11/29/2017 01:38    Anti-infectives: Anti-infectives (From admission, onward)   Start     Dose/Rate Route Frequency Ordered Stop   11/29/17 0800  ceFAZolin (ANCEF) IVPB 2g/100 mL premix     2 g 200 mL/hr over 30 Minutes Intravenous To [facility] 11/29/17 0325 11/29/17 0435       Assessment/Plan Incarcerated umbilical hernia S/p umbilical hernia repair with mesh - POD#0 - tolerating clears - may advance to fulls  - encourage PO pain control - mobilize as tolerated  FEN: Advance to fulls and soft tomorrow VTE: scds, lovenox ID: [id number] periop     LOS: 0 days    Brigid Re , [facility] 11/29/2017, 2:22 PM Pager: [phone] Consults: [phone] Mon-Fri 7:00 am-4:30 pm Sat-Sun 7:00 am-11:30 am

## 2017-11-29 NOTE — Plan of Care (Signed)
  Progressing Education: Knowledge of General Education information will improve 11/29/2017 1559 - Progressing by Rance Muir, RN Health Behavior/Discharge Planning: Ability to manage health-related needs will improve 11/29/2017 1559 - Progressing by Rance Muir, RN Clinical Measurements: Ability to maintain clinical measurements within normal limits will improve 11/29/2017 1559 - Progressing by Rance Muir, RN Will remain free from infection 11/29/2017 1559 - Progressing by Rance Muir, RN Diagnostic test results will improve 11/29/2017 1559 - Progressing by Rance Muir, RN Respiratory complications will improve 11/29/2017 1559 - Progressing by Rance Muir, RN Cardiovascular complication will be avoided 11/29/2017 1559 - Progressing by Rance Muir, RN Activity: Risk for activity intolerance will decrease 11/29/2017 1559 - Progressing by Rance Muir, RN Nutrition: Adequate nutrition will be maintained 11/29/2017 1559 - Progressing by Rance Muir, RN Coping: Level of anxiety will decrease 11/29/2017 1559 - Progressing by Rance Muir, RN Elimination: Will not experience complications related to bowel motility 11/29/2017 1559 - Progressing by Rance Muir, RN Will not experience complications related to urinary retention 11/29/2017 1559 - Progressing by Rance Muir, RN Pain Managment: General experience of comfort will improve 11/29/2017 1559 - Progressing by Rance Muir, RN Safety: Ability to remain free from injury will improve 11/29/2017 1559 - Progressing by Rance Muir, RN Skin Integrity: Risk for impaired skin integrity will decrease 11/29/2017 1559 - Progressing by Rance Muir, RN

## 2017-11-29 NOTE — ED Provider Notes (Signed)
[facility] EMERGENCY DEPARTMENT Provider Note   CSN: [id number] Arrival date & time: 11/28/17  1628     History   Chief Complaint Chief Complaint  Patient presents with  . Nausea  . Hernia    HPI Marc May is a 42 y.o. male with a hx of diverticulitis, umbilical hernia, osteoarthritis presents to the Emergency Department complaining of gradual, persistent, progressively worsening periumbilical abdominal pain onset this morning.  Patient reports he has had his hernia for several years and it has always been completely reducible until several days ago.  He reports he stopped being able to reduce it several days ago but it did not create pain until today.  He reports today he has had chills, sweats, severe abdominal pain, 2 episodes of loose stools and lightheadedness.  He denies melena or hematochezia.  Patient reports that he has been told that he needs surgery for his diverticulitis but wanted to wait.  Patient reports he is tried ice, laying flat and taking a muscle relaxer without ability to reduce the hernia.  Makes the symptoms better.  Eating makes them worse.  He denies measured fever, headache, neck pain, chest pain, shortness of breath, dysuria, hematuria.   The history is provided by the patient and medical records. No language interpreter was used.    Past Medical History:  Diagnosis Date  . Diverticulitis   . Hernia of abdominal cavity   . Osteoarthritis    knees  . Sleep apnea     There are no active problems to display for this patient.   Past Surgical History:  Procedure Laterality Date  . KNEE ARTHROSCOPY         Home Medications    Prior to Admission medications   Medication Sig Start Date End Date Taking? Authorizing Provider  docusate sodium (COLACE) 100 MG capsule Take 100 mg by mouth 2 (two) times daily as needed for mild constipation.   Yes [provider]  magnesium citrate SOLN Take 1 Bottle by mouth as needed  for severe constipation.   Yes [provider]    Family History History reviewed. No pertinent family history.  Social History Social History   Tobacco Use  . Smoking status: Not on file  Substance Use Topics  . Alcohol use: Not on file  . Drug use: Not on file     Allergies   Other; Banana; and Bee venom   Review of Systems Review of Systems  Constitutional: Positive for chills. Negative for appetite change, diaphoresis, fatigue, fever and unexpected weight change.  HENT: Negative for mouth sores.   Eyes: Negative for visual disturbance.  Respiratory: Negative for cough, chest tightness, shortness of breath and wheezing.   Cardiovascular: Negative for chest pain.  Gastrointestinal: Positive for abdominal pain, diarrhea and nausea. Negative for constipation and vomiting.       Hernia  Endocrine: Negative for polydipsia, polyphagia and polyuria.  Genitourinary: Negative for dysuria, frequency, hematuria and urgency.  Musculoskeletal: Negative for back pain and neck stiffness.  Skin: Negative for rash.  Allergic/Immunologic: Negative for immunocompromised state.  Neurological: Positive for light-headedness. Negative for syncope and headaches.  Hematological: Does not bruise/bleed easily.  Psychiatric/Behavioral: Negative for sleep disturbance. The patient is not nervous/anxious.      Physical Exam Updated Vital Signs BP 123/82 (BP Location: Right Arm)   Pulse 73   Temp 98.8 F (37.1 C) (Oral)   Resp 15   SpO2 100%   Physical Exam  Constitutional: He  appears well-developed and well-nourished. He appears distressed ( Patient appears uncomfortable).  Awake, alert, nontoxic appearance  HENT:  Head: Normocephalic and atraumatic.  Mouth/Throat: Oropharynx is clear and moist. No oropharyngeal exudate.  Eyes: Conjunctivae are normal. No scleral icterus.  Neck: Normal range of motion. Neck supple.  Cardiovascular: Normal rate, regular rhythm and intact distal  pulses.  Pulmonary/Chest: Effort normal and breath sounds normal. No respiratory distress. He has no wheezes.  Equal chest expansion  Abdominal: Soft. Bowel sounds are normal. He exhibits no distension and no mass. There is tenderness in the periumbilical area. There is no rigidity, no rebound, no guarding and no CVA tenderness. A hernia is present. Hernia confirmed positive in the ventral area.    Musculoskeletal: Normal range of motion. He exhibits no edema.  Neurological: He is alert.  Speech is clear and goal oriented Moves extremities without ataxia  Skin: Skin is warm and dry. He is not diaphoretic.  Psychiatric: He has a normal mood and affect.  Nursing note and vitals reviewed.    ED Treatments / Results  Labs (all labs ordered are listed, but only abnormal results are displayed) Labs Reviewed  COMPREHENSIVE METABOLIC PANEL - Abnormal; Notable for the following components:      Result Value   Glucose, Bld 110 (*)    All other components within normal limits  LIPASE, BLOOD  CBC  URINALYSIS, ROUTINE W REFLEX MICROSCOPIC  I-STAT CG4 LACTIC ACID, ED  I-STAT CG4 LACTIC ACID, ED    Radiology Ct Abdomen Pelvis W Contrast  Result Date: 11/29/2017 CLINICAL DATA:  42 year old male with abdominal hernia. Abdominal pain, nausea vomiting. EXAM: CT ABDOMEN AND PELVIS WITH CONTRAST TECHNIQUE: Multidetector CT imaging of the abdomen and pelvis was performed using the standard protocol following bolus administration of intravenous contrast. CONTRAST:  141mL ISOVUE-300 IOPAMIDOL (ISOVUE-300) INJECTION 61% COMPARISON:  None. FINDINGS: Lower chest: The visualized lung bases are clear. No intra-free air or free fluid Hepatobiliary: Probable mild fatty liver. No intrahepatic biliary ductal dilatation. The gallbladder is unremarkable. Pancreas: Unremarkable. No pancreatic ductal dilatation or surrounding inflammatory changes. Spleen: Normal in size without focal abnormality. Adrenals/Urinary  Tract: Adrenal glands are unremarkable. Kidneys are normal, without renal calculi, focal lesion, or hydronephrosis. Bladder is unremarkable. Stomach/Bowel: There is sigmoid diverticulosis there is thickening of a segment of the sigmoid colon most consistent with muscular hypertrophy related to chronic inflammation. Underlying mass is not entirely excluded. There is no active inflammatory changes. There is no bowel obstruction or active inflammation. Normal appendix. Vascular/Lymphatic: No significant vascular findings are present. No enlarged abdominal or pelvic lymph nodes. Reproductive: The prostate and seminal vesicles are grossly unremarkable. Other: There is a 3.5 cm peritoneal defect at the level of the umbilicus with herniation of the omental fat. There is diffuse stranding and inflammatory changes of the herniated fat most consistent with strangulation likely secondary to incarceration. Clinical correlation is recommended. No fluid collection or abscess. Musculoskeletal: Mild degenerative changes of the lower lumbar spine. No acute osseous pathology. IMPRESSION: 1. Fat containing umbilical hernia with inflammatory changes most consistent with strangulation and suggestive of incarceration. No fluid collection or abscess. 2. Sigmoid diverticulosis with muscular hypertrophy. Underlying sigmoid mass is less likely. No bowel obstruction. Normal appendix. Electronically Signed   By: Anner Crete M.D.   On: 11/29/2017 01:38    Procedures Procedures (including critical care time)  Medications Ordered in ED Medications  ceFAZolin (ANCEF) IVPB 2g/100 mL premix (not administered)  sodium chloride 0.9 % bolus 1,000 mL (  0 mLs Intravenous Stopped 11/29/17 0221)  morphine 4 MG/ML injection 4 mg (4 mg Intravenous Given 11/29/17 0010)  iopamidol (ISOVUE-300) 61 % injection (100 mLs  Contrast Given 11/29/17 0036)  morphine 4 MG/ML injection 4 mg (4 mg Intravenous Given 11/29/17 0324)     Initial Impression /  Assessment and Plan / ED Course  I have reviewed the triage vital signs and the nursing notes.  Pertinent labs & imaging results that were available during my care of the patient were reviewed by me and considered in my medical decision making (see chart for details).  Clinical Course as of Nov 29 401  Fri Nov 29, 2017  0321 Discussed with Dr. Kieth Brightly of [facility] who will evaluate and admit  [HM]    Clinical Course User Index [HM] Sobia Karger, Jarrett Soho, [place]    Patient presents with umbilical/ventral hernia present for some time but became nonreducible 2 days ago.  Patient presents with abdominal pain today.  Unable to reduce hernia despite application of ice in Trendelenburg position.  CT scan shows  Fat containing umbilical hernia with inflammatory changes most consistent with strangulation and suggestive of incarceration. No fluid collection or abscess.  Patient will need admission and management by surgery.  Discussed with general surgery who will admit.  Patient and family do state that they do not want any blood products at all due to religious beliefs.     Final Clinical Impressions(s) / ED Diagnoses   Final diagnoses:  Hernia of abdominal cavity  Incarcerated hernia    ED Discharge Orders    None       Journey Ratterman, Gwenlyn Perking 11/29/17 0403    Ward, Delice Bison, DO 11/29/17 6606

## 2017-11-29 NOTE — Procedures (Signed)
Pt has home CPAP and states he will place on when ready.

## 2017-11-29 NOTE — Progress Notes (Signed)
RT called to pt room because family member concerned about pt gasping during sleep. Pt has home CPAP and RT originally called biomed to check home CPAP machine at 09:30. Placed pt on home CPAP machine per pt request to take nap. Paged biomed stat to pt room and explained that pt had to go on CPAP immediately.

## 2017-11-29 NOTE — H&P (Signed)
Marc May is an 42 y.o. male.   Chief Complaint: hernia HPI: 42 yo male with abdominal wall hernia. He has had the hernia for years and has had to reduce it manually multiple times before. 3 days ago he was not able to reduce it and today it has become increasingly painful. He also notes nausea, shortness of breath and increased pain with movement. He has had no previous abdominal surgery. He does not smoke and he does not have diabetes.  Past Medical History:  Diagnosis Date  . Diverticulitis   . Hernia of abdominal cavity   . Osteoarthritis    knees  . Sleep apnea     Past Surgical History:  Procedure Laterality Date  . KNEE ARTHROSCOPY      No family history on file. Social History:  has no tobacco, alcohol, and drug history on file.  Allergies:  Allergies  Allergen Reactions  . Other Anaphylaxis    ANTIBIOTIC; pt does not remember which one  . Banana   . Bee Venom      (Not in a hospital admission)  Results for orders placed or performed during the hospital encounter of 11/28/17 (from the past 48 hour(s))  Lipase, blood     Status: None   Collection Time: 11/28/17  5:10 PM  Result Value Ref Range   Lipase 31 11 - 51 U/L  Comprehensive metabolic panel     Status: Abnormal   Collection Time: 11/28/17  5:10 PM  Result Value Ref Range   Sodium 138 135 - 145 mmol/L   Potassium 4.2 3.5 - 5.1 mmol/L   Chloride 104 101 - 111 mmol/L   CO2 25 22 - 32 mmol/L   Glucose, Bld 110 (H) 65 - 99 mg/dL   BUN 10 6 - 20 mg/dL   Creatinine, Ser 0.93 0.61 - 1.24 mg/dL   Calcium 9.0 8.9 - 10.3 mg/dL   Total Protein 7.3 6.5 - 8.1 g/dL   Albumin 4.0 3.5 - 5.0 g/dL   AST 21 15 - 41 U/L   ALT 25 17 - 63 U/L   Alkaline Phosphatase 73 38 - 126 U/L   Total Bilirubin 0.5 0.3 - 1.2 mg/dL   GFR calc non Af Amer >60 >60 mL/min   GFR calc Af Amer >60 >60 mL/min    Comment: (NOTE) The eGFR has been calculated using the CKD EPI equation. This calculation has not been validated in all  clinical situations. eGFR's persistently <60 mL/min signify possible Chronic Kidney Disease.    Anion gap 9 5 - 15  CBC     Status: None   Collection Time: 11/28/17  5:10 PM  Result Value Ref Range   WBC 8.5 4.0 - 10.5 K/uL   RBC 5.21 4.22 - 5.81 MIL/uL   Hemoglobin 14.2 13.0 - 17.0 g/dL   HCT 43.7 39.0 - 52.0 %   MCV 83.9 78.0 - 100.0 fL   MCH 27.3 26.0 - 34.0 pg   MCHC 32.5 30.0 - 36.0 g/dL   RDW 13.4 11.5 - 15.5 %   Platelets 290 150 - 400 K/uL  Urinalysis, Routine w reflex microscopic     Status: None   Collection Time: 11/28/17  5:10 PM  Result Value Ref Range   Color, Urine YELLOW YELLOW   APPearance CLEAR CLEAR   Specific Gravity, Urine 1.013 1.005 - 1.030   pH 5.0 5.0 - 8.0   Glucose, UA NEGATIVE NEGATIVE mg/dL   Hgb urine dipstick NEGATIVE NEGATIVE  Bilirubin Urine NEGATIVE NEGATIVE   Ketones, ur NEGATIVE NEGATIVE mg/dL   Protein, ur NEGATIVE NEGATIVE mg/dL   Nitrite NEGATIVE NEGATIVE   Leukocytes, UA NEGATIVE NEGATIVE  I-Stat CG4 Lactic Acid, ED     Status: None   Collection Time: 11/29/17 12:32 AM  Result Value Ref Range   Lactic Acid, Venous 1.18 0.5 - 1.9 mmol/L  I-Stat CG4 Lactic Acid, ED     Status: None   Collection Time: 11/29/17  2:36 AM  Result Value Ref Range   Lactic Acid, Venous 0.97 0.5 - 1.9 mmol/L   Ct Abdomen Pelvis W Contrast  Result Date: 11/29/2017 CLINICAL DATA:  42 year old male with abdominal hernia. Abdominal pain, nausea vomiting. EXAM: CT ABDOMEN AND PELVIS WITH CONTRAST TECHNIQUE: Multidetector CT imaging of the abdomen and pelvis was performed using the standard protocol following bolus administration of intravenous contrast. CONTRAST:  140m ISOVUE-300 IOPAMIDOL (ISOVUE-300) INJECTION 61% COMPARISON:  None. FINDINGS: Lower chest: The visualized lung bases are clear. No intra-free air or free fluid Hepatobiliary: Probable mild fatty liver. No intrahepatic biliary ductal dilatation. The gallbladder is unremarkable. Pancreas: Unremarkable.  No pancreatic ductal dilatation or surrounding inflammatory changes. Spleen: Normal in size without focal abnormality. Adrenals/Urinary Tract: Adrenal glands are unremarkable. Kidneys are normal, without renal calculi, focal lesion, or hydronephrosis. Bladder is unremarkable. Stomach/Bowel: There is sigmoid diverticulosis there is thickening of a segment of the sigmoid colon most consistent with muscular hypertrophy related to chronic inflammation. Underlying mass is not entirely excluded. There is no active inflammatory changes. There is no bowel obstruction or active inflammation. Normal appendix. Vascular/Lymphatic: No significant vascular findings are present. No enlarged abdominal or pelvic lymph nodes. Reproductive: The prostate and seminal vesicles are grossly unremarkable. Other: There is a 3.5 cm peritoneal defect at the level of the umbilicus with herniation of the omental fat. There is diffuse stranding and inflammatory changes of the herniated fat most consistent with strangulation likely secondary to incarceration. Clinical correlation is recommended. No fluid collection or abscess. Musculoskeletal: Mild degenerative changes of the lower lumbar spine. No acute osseous pathology. IMPRESSION: 1. Fat containing umbilical hernia with inflammatory changes most consistent with strangulation and suggestive of incarceration. No fluid collection or abscess. 2. Sigmoid diverticulosis with muscular hypertrophy. Underlying sigmoid mass is less likely. No bowel obstruction. Normal appendix. Electronically Signed   By: AAnner CreteM.D.   On: 11/29/2017 01:38    Review of Systems  Constitutional: Negative for chills and fever.  HENT: Negative for hearing loss.   Eyes: Negative for blurred vision and double vision.  Respiratory: Positive for shortness of breath. Negative for cough and hemoptysis.   Cardiovascular: Negative for chest pain and palpitations.  Gastrointestinal: Positive for abdominal pain and  nausea. Negative for vomiting.  Genitourinary: Negative for dysuria and urgency.  Musculoskeletal: Negative for myalgias and neck pain.  Skin: Negative for itching and rash.  Neurological: Negative for dizziness, tingling and headaches.  Endo/Heme/Allergies: Does not bruise/bleed easily.  Psychiatric/Behavioral: Negative for depression and suicidal ideas.    Blood pressure 123/82, pulse 73, temperature 98.8 F (37.1 C), temperature source Oral, resp. rate 15, SpO2 100 %. Physical Exam  Vitals reviewed. Constitutional: He is oriented to person, place, and time. He appears well-developed and well-nourished.  HENT:  Head: Normocephalic and atraumatic.  Eyes: Conjunctivae and EOM are normal. Pupils are equal, round, and reactive to light.  Neck: Normal range of motion. Neck supple.  Cardiovascular: Normal rate and regular rhythm.  Respiratory: Effort normal and breath sounds  normal.  GI: Soft. He exhibits no distension.  8cm umbilical hernia, tender to palpation, no skin changes  Musculoskeletal: Normal range of motion.  Neurological: He is alert and oriented to person, place, and time.  Skin: Skin is warm and dry.  Psychiatric: He has a normal mood and affect. His behavior is normal.     Assessment/Plan 42 yo male with umbilical hernia with omental incarceration -open umbilical hernia repair with possible mesh insertion -planned inpatient admission -IV abx  Mickeal Skinner, MD 11/29/2017, 3:21 AM

## 2017-11-29 NOTE — ED Notes (Signed)
Patient transported to CT 

## 2017-11-30 LAB — CBC
HEMATOCRIT: 38.5 % — AB (ref 39.0–52.0)
HEMOGLOBIN: 12.7 g/dL — AB (ref 13.0–17.0)
MCH: 27.4 pg (ref 26.0–34.0)
MCHC: 33 g/dL (ref 30.0–36.0)
MCV: 83 fL (ref 78.0–100.0)
Platelets: 288 10*3/uL (ref 150–400)
RBC: 4.64 MIL/uL (ref 4.22–5.81)
RDW: 13.2 % (ref 11.5–15.5)
WBC: 10.6 10*3/uL — ABNORMAL HIGH (ref 4.0–10.5)

## 2017-11-30 LAB — BASIC METABOLIC PANEL
ANION GAP: 10 (ref 5–15)
BUN: 11 mg/dL (ref 6–20)
CHLORIDE: 102 mmol/L (ref 101–111)
CO2: 24 mmol/L (ref 22–32)
Calcium: 8.4 mg/dL — ABNORMAL LOW (ref 8.9–10.3)
Creatinine, Ser: 0.7 mg/dL (ref 0.61–1.24)
GFR calc Af Amer: >60 mL/min (ref >60)
GFR calc non Af Amer: >60 mL/min (ref >60)
Glucose, Bld: 104 mg/dL — ABNORMAL HIGH (ref 65–99)
POTASSIUM: 3.7 mmol/L (ref 3.5–5.1)
Sodium: 136 mmol/L (ref 135–145)

## 2017-11-30 MED ORDER — OXYCODONE HCL 5 MG PO TABS: 5.0000 mg | ORAL_TABLET | ORAL | 0 refills | Status: DC | PRN

## 2017-11-30 NOTE — Progress Notes (Signed)
Pt and wife were very concerned about appearance of op site. They felt that umbilicus was more swollen and tender. Dr. Owens Shark notified. He said this was normal and was probably a seroma. Explained this to pt and wife. They were very relieved and appreciative. Pt bathed. Given meds prior to discharge and is discharged with wife to home.

## 2017-11-30 NOTE — Progress Notes (Signed)
1 Day Post-Op   Subjective/Chief Complaint: Doing well tol PO   Objective: Vital signs in last 24 hours: Temp:  [97.4 F (36.3 C)-98 F (36.7 C)] 97.4 F (36.3 C) (01/19 0800) Pulse Rate:  [63-80] 69 (01/19 0800) Resp:  [16-18] 18 (01/19 0800) BP: (101-124)/(60-75) 117/64 (01/19 0800) SpO2:  [95 %-98 %] 98 % (01/19 0800) Last BM Date: (pta)  Intake/Output from previous day: 01/18 0701 - 01/19 0700 In: 1905 [P.O.:480; I.V.:1425] Out: 1525 [Urine:1525] Intake/Output this shift: No intake/output data recorded.  General appearance: alert and cooperative GI: soft, non-tender; bowel sounds normal; no masses,  no organomegaly and inc c/d/i  Lab Results:  Recent Labs    11/29/17 0902 11/30/17 0554  WBC 13.2* 10.6*  HGB 14.3 12.7*  HCT 44.2 38.5*  PLT 279 288   BMET Recent Labs    11/28/17 1710 11/29/17 0902 11/30/17 0554  NA 138  --  136  K 4.2  --  3.7  CL 104  --  102  CO2 25  --  24  GLUCOSE 110*  --  104*  BUN 10  --  11  CREATININE 0.93 0.94 0.70  CALCIUM 9.0  --  8.4*   PT/INR No results for input(s): LABPROT, INR in the last 72 hours. ABG No results for input(s): PHART, HCO3 in the last 72 hours.  Invalid input(s): PCO2, PO2  Studies/Results: Ct Abdomen Pelvis W Contrast  Result Date: 11/29/2017 CLINICAL DATA:  42 year old male with abdominal hernia. Abdominal pain, nausea vomiting. EXAM: CT ABDOMEN AND PELVIS WITH CONTRAST TECHNIQUE: Multidetector CT imaging of the abdomen and pelvis was performed using the standard protocol following bolus administration of intravenous contrast. CONTRAST:  1101mL ISOVUE-300 IOPAMIDOL (ISOVUE-300) INJECTION 61% COMPARISON:  None. FINDINGS: Lower chest: The visualized lung bases are clear. No intra-free air or free fluid Hepatobiliary: Probable mild fatty liver. No intrahepatic biliary ductal dilatation. The gallbladder is unremarkable. Pancreas: Unremarkable. No pancreatic ductal dilatation or surrounding inflammatory  changes. Spleen: Normal in size without focal abnormality. Adrenals/Urinary Tract: Adrenal glands are unremarkable. Kidneys are normal, without renal calculi, focal lesion, or hydronephrosis. Bladder is unremarkable. Stomach/Bowel: There is sigmoid diverticulosis there is thickening of a segment of the sigmoid colon most consistent with muscular hypertrophy related to chronic inflammation. Underlying mass is not entirely excluded. There is no active inflammatory changes. There is no bowel obstruction or active inflammation. Normal appendix. Vascular/Lymphatic: No significant vascular findings are present. No enlarged abdominal or pelvic lymph nodes. Reproductive: The prostate and seminal vesicles are grossly unremarkable. Other: There is a 3.5 cm peritoneal defect at the level of the umbilicus with herniation of the omental fat. There is diffuse stranding and inflammatory changes of the herniated fat most consistent with strangulation likely secondary to incarceration. Clinical correlation is recommended. No fluid collection or abscess. Musculoskeletal: Mild degenerative changes of the lower lumbar spine. No acute osseous pathology. IMPRESSION: 1. Fat containing umbilical hernia with inflammatory changes most consistent with strangulation and suggestive of incarceration. No fluid collection or abscess. 2. Sigmoid diverticulosis with muscular hypertrophy. Underlying sigmoid mass is less likely. No bowel obstruction. Normal appendix. Electronically Signed   By: Anner Crete M.D.   On: 11/29/2017 01:38    Anti-infectives: Anti-infectives (From admission, onward)   Start     Dose/Rate Route Frequency Ordered Stop   11/29/17 0800  ceFAZolin (ANCEF) IVPB 2g/100 mL premix     2 g 200 mL/hr over 30 Minutes Intravenous To [facility] 11/29/17 0325 11/29/17 0435  Assessment/Plan: s/p Procedure(s): HERNIA REPAIR UMBILICAL ADULT (N/A) Advance diet  Mobilize Poss home later this PM  LOS: 1 day     Rosario Jacks., [facility] 11/30/2017

## 2017-11-30 NOTE — Discharge Instructions (Signed)
[facility] _______[facility] ° °UMBILICAL  HERNIA REPAIR: POST OP INSTRUCTIONS ° °Always review your discharge instruction sheet given to you by the facility where your surgery was performed. °IF YOU HAVE DISABILITY OR FAMILY LEAVE FORMS, YOU MUST BRING THEM TO THE OFFICE FOR PROCESSING.   °DO NOT GIVE THEM TO YOUR DOCTOR. ° °1. A  prescription for pain medication may be given to you upon discharge.  Take your pain medication as prescribed, if needed.  If narcotic pain medicine is not needed, then you may take acetaminophen (Tylenol) or ibuprofen (Advil) as needed. °2. Take your usually prescribed medications unless otherwise directed. °If you need a refill on your pain medication, please contact your pharmacy.  They will contact our office to request authorization. Prescriptions will not be filled after 5 pm or on week-ends. °3. You should follow a light diet the first 24 hours after arrival home, such as soup and crackers, etc.  Be sure to include lots of fluids daily.  Resume your normal diet the day after surgery. °4.Most patients will experience some swelling and bruising around the umbilicus or in the groin and scrotum.  Ice packs and reclining will help.  Swelling and bruising can take several days to resolve.  °6. It is common to experience some constipation if taking pain medication after surgery.  Increasing fluid intake and taking a stool softener (such as Colace) will usually help or prevent this problem from occurring.  A mild laxative (Milk of Magnesia or Miralax) should be taken according to package directions if there are no bowel movements after 48 hours. °7. Unless discharge instructions indicate otherwise, you may remove your bandages 24-48 hours after surgery, and you may shower at that time.  You may have steri-strips (small skin tapes) in place directly over the incision.  These strips should be left on the skin for 7-10 days.  If your surgeon used skin glue on the incision, you may  shower in 24 hours.  The glue will flake off over the next 2-3 weeks.  Any sutures or staples will be removed at the office during your follow-up visit. °8. ACTIVITIES:  You may resume regular (light) daily activities beginning the next day--such as daily self-care, walking, climbing stairs--gradually increasing activities as tolerated.  You may have sexual intercourse when it is comfortable.  Refrain from any heavy lifting or straining until approved by your doctor. ° °a.You may drive when you are no longer taking prescription pain medication, you can comfortably wear a seatbelt, and you can safely maneuver your car and apply brakes. °b.RETURN TO WORK:   °_____________________________________________ ° °9.You should see your doctor in the office for a follow-up appointment approximately 2-3 weeks after your surgery.  Make sure that you call for this appointment within a day or two after you arrive home to insure a convenient appointment time. °10.OTHER INSTRUCTIONS: _________________________ °   _____________________________________ ° °WHEN TO CALL YOUR DOCTOR: °1. Fever over 101.0 °2. Inability to urinate °3. Nausea and/or vomiting °4. Extreme swelling or bruising °5. Continued bleeding from incision. °6. Increased pain, redness, or drainage from the incision ° °The clinic staff is available to answer your questions during regular business hours.  Please don’t hesitate to call and ask to speak to one of the nurses for clinical concerns.  If you have a medical emergency, go to the nearest emergency room or call 911.  A surgeon from [facility] is always on call at the hospital ° ° °[address], [address], [place], [place]  [address] ? ° [address] [address], [place], [place]   [address] °[phone] ? [phone] ? FAX [phone] °Web site: [email or web address] ° °

## 2017-11-30 NOTE — Discharge Summary (Signed)
Physician Discharge Summary  Patient ID: Marc May MRN: [id number] DOB/AGE: 04-21-1976 42 y.o.  Admit date: 11/28/2017 Discharge date: 11/30/2017  Admission Diagnoses: Incarcerated umbilical hernia  Discharge Diagnoses: Status post open umbilical hernia repair Active Problems:   Umbilical hernia   Strangulated umbilical hernia   Discharged Condition: good  Hospital Course: Postoperatively patient was sent to the floor.  Patient was on a liquid diet advance to regular diet which he was able to tolerate well.  Patient had good pain control.  Patient was otherwise ambulating well on his own.  He had good bowel function.  He had good pain control.  He is otherwise afebrile, deemed stable for discharge and discharged home.  Consults: None  Significant Diagnostic Studies: None  Treatments: surgery: As above  Discharge Exam: Blood pressure 132/69, pulse 92, temperature 98 F (36.7 C), temperature source Oral, resp. rate 18, height 5\' 9"  (1.753 m), weight 120.2 kg (265 lb), SpO2 95 %. General appearance: alert and cooperative GI: soft, non-tender; bowel sounds normal; no masses,  no organomegaly incision clean dry and intact  Disposition: Final discharge disposition not confirmed  Discharge Instructions    Diet - low sodium heart healthy   Complete by:  As directed    Increase activity slowly   Complete by:  As directed      Allergies as of 11/30/2017      Reactions   Other Anaphylaxis   ANTIBIOTIC; pt does not remember which one   Banana    Bee Venom       Medication List    TAKE these medications   docusate sodium 100 MG capsule Commonly known as:  COLACE Take 100 mg by mouth 2 (two) times daily as needed for mild constipation.   magnesium citrate Soln Take 1 Bottle by mouth as needed for severe constipation.   oxyCODONE 5 MG immediate release tablet Commonly known as:  Oxy IR/ROXICODONE Take 1-2 tablets (5-10 mg total) by mouth every 4 (four) hours as needed  for moderate pain.      Follow-up [facility], [place]. Schedule an appointment as soon as possible for a visit in 2 week(s).   Specialty:  General Surgery Contact information: [address] [place] [phone]          Signed: Rosario Jacks., Anne Hahn 11/30/2017, 3:30 PM

## 2017-11-30 NOTE — Progress Notes (Signed)
Paged Dr. Rosendo Gros advised pt has ambulated and had questions about DVT's at home.  Pt stated he would be ready to go later this evening.  Has to wait on transportation home.

## 2017-11-30 NOTE — Progress Notes (Signed)
Pt has home unit and places self on/off.  RT will be available if needed.  Pt aware and RN aware.

## 2017-11-30 NOTE — Progress Notes (Signed)
Pt ambulated hall with and without the walker.  Pt tolerated well. Pt stated he felt comfortable going home later today.

## 2017-12-02 ENCOUNTER — Encounter (HOSPITAL_COMMUNITY): Payer: Self-pay | Admitting: General Surgery

## 2017-12-02 NOTE — Anesthesia Postprocedure Evaluation (Signed)
Anesthesia Post Note  Patient: Marc May  Procedure(s) Performed: HERNIA REPAIR UMBILICAL ADULT (N/A Abdomen)     Patient location during evaluation: PACU Anesthesia Type: General Level of consciousness: awake and alert Pain management: pain level controlled Vital Signs Assessment: post-procedure vital signs reviewed and stable Respiratory status: spontaneous breathing, nonlabored ventilation and respiratory function stable Cardiovascular status: blood pressure returned to baseline and stable Postop Assessment: no apparent nausea or vomiting Anesthetic complications: no    Last Vitals:  Vitals:   11/30/17 1600 11/30/17 1933  BP:  130/73  Pulse:  75  Resp:  17  Temp: 36.9 C 36.7 C  SpO2:  96%    Last Pain:  Vitals:   11/30/17 2000  TempSrc:   PainSc: [place]

## 2018-01-27 ENCOUNTER — Encounter (HOSPITAL_COMMUNITY): Payer: Self-pay | Admitting: General Surgery

## 2018-01-28 ENCOUNTER — Encounter: Payer: Self-pay | Admitting: Student

## 2018-02-04 DIAGNOSIS — J069 Acute upper respiratory infection, unspecified: Secondary | ICD-10-CM | POA: Diagnosis not present

## 2018-02-19 DIAGNOSIS — R509 Fever, unspecified: Secondary | ICD-10-CM | POA: Diagnosis not present

## 2018-02-19 DIAGNOSIS — B349 Viral infection, unspecified: Secondary | ICD-10-CM | POA: Diagnosis not present

## 2018-10-29 ENCOUNTER — Encounter (INDEPENDENT_AMBULATORY_CARE_PROVIDER_SITE_OTHER): Payer: Self-pay | Admitting: Orthopaedic Surgery

## 2018-10-29 ENCOUNTER — Ambulatory Visit (INDEPENDENT_AMBULATORY_CARE_PROVIDER_SITE_OTHER): Payer: Self-pay

## 2018-10-29 ENCOUNTER — Ambulatory Visit (INDEPENDENT_AMBULATORY_CARE_PROVIDER_SITE_OTHER): Payer: BLUE CROSS/BLUE SHIELD | Admitting: Orthopaedic Surgery

## 2018-10-29 DIAGNOSIS — M25562 Pain in left knee: Secondary | ICD-10-CM

## 2018-10-29 DIAGNOSIS — M1711 Unilateral primary osteoarthritis, right knee: Secondary | ICD-10-CM | POA: Diagnosis not present

## 2018-10-29 DIAGNOSIS — M1712 Unilateral primary osteoarthritis, left knee: Secondary | ICD-10-CM

## 2018-10-29 DIAGNOSIS — G8929 Other chronic pain: Secondary | ICD-10-CM

## 2018-10-29 DIAGNOSIS — M25561 Pain in right knee: Secondary | ICD-10-CM | POA: Diagnosis not present

## 2018-10-29 MED ORDER — METHYLPREDNISOLONE ACETATE 40 MG/ML IJ SUSP
40.0000 mg | INTRAMUSCULAR | Status: AC | PRN
  Administered 2018-10-29: 40 mg via INTRA_ARTICULAR

## 2018-10-29 MED ORDER — LIDOCAINE HCL 1 % IJ SOLN
3.0000 mL | INTRAMUSCULAR | Status: AC | PRN
  Administered 2018-10-29: 3 mL

## 2018-10-29 NOTE — Progress Notes (Signed)
Office Visit Note   Patient: Marc May           Date of Birth: 16-Jan-1976           MRN: [id number] Visit Date: 10/29/2018              Requested by: [place], [place] [id number] PCP: [place]   Assessment & Plan: Visit Diagnoses:  1. Bilateral chronic knee pain   2. Unilateral primary osteoarthritis, left knee   3. Unilateral primary osteoarthritis, right knee   4. Chronic pain of left knee   5. Chronic pain of right knee     Plan: Given the severity of his knee arthritis and the failure of conservative treatment combined with his daily pain, his decreased mobility, and the detrimental effect this has had on his quality of life, I agree that knee replacement surgery is warranted.  Also his x-rays show severe tricompartmental arthritic changes in both knees.  I showed him a knee model and explained in detail what surgery involves.  He understands the heightened risk of acute blood loss anemia, nerve and vessel injury, fracture, infection, implant failure and DVT which again can be heightened due to his obesity and the fact that he needs both knees done at once.  He is also Jehovah's Witness and we would not consider any blood transfusions.  With having said we still feel comfortable with proceeding with the surgery given the severity of both his knees.  He understands of preoperative labs show any significant anemia we would only recommend 1 knee.  He also understands that we would recommend spinal anesthesia and potentially a continuous spinal epidural for the first 24 hours after surgery.  We would also have him on blood thinning medication to prevent DVT.  Of course transexamic acid would help with the acute blood loss anemia.  We had a long and thorough discussion about knee replacement surgery.  I did place a steroid injection in both his knees today to help get him through the holidays and  the early winter.  He would like to have the surgery scheduled for March 2020.  All question concerns were answered and addressed.  We will be in touch.  Follow-Up Instructions: Return for 2 weeks post-op.   Orders:  Orders Placed This Encounter  Procedures  . XR KNEE 3 VIEW LEFT  . XR Knee 1-2 Views Right   No orders of the defined types were placed in this encounter.     Procedures: Large Joint Inj: R knee on 10/29/2018 5:53 PM Indications: diagnostic evaluation and pain Details: 22 G 1.5 in needle, superolateral approach  Arthrogram: No  Medications: 3 mL lidocaine 1 %; 40 mg methylPREDNISolone acetate 40 MG/ML Outcome: tolerated well, no immediate complications Procedure, treatment alternatives, risks and benefits explained, specific risks discussed. Consent was given by the patient. Immediately prior to procedure a time out was called to verify the correct patient, procedure, equipment, support staff and site/side marked as required. Patient was prepped and draped in the usual sterile fashion.   Large Joint Inj: L knee on 10/29/2018 5:53 PM Indications: diagnostic evaluation and pain Details: 22 G 1.5 in needle, superolateral approach  Arthrogram: No  Medications: 3 mL lidocaine 1 %; 40 mg methylPREDNISolone acetate 40 MG/ML Outcome: tolerated well, no immediate complications Procedure, treatment alternatives, risks and benefits explained, specific risks discussed. Consent was given by the patient. Immediately prior to  procedure a time out was called to verify the correct patient, procedure, equipment, support staff and site/side marked as required. Patient was prepped and draped in the usual sterile fashion.       Clinical Data: No additional findings.   Subjective: Chief Complaint  Patient presents with  . Left Knee - Pain  . Right Knee - Pain  The patient is a very pleasant 42 year old gentleman with unfortunate debilitating arthritis involving both his knees.   He is originally from [place] but now lives in [place].  At one point he was scheduled for bilateral total knee arthroplasties after the failed conservative treatment but then he relocated with his job.  He has had bilateral knee steroid injections numerous times as well as multiple aspirations.  He is work on activity modification and weight loss.  He has been through physical therapy.  He still swims.  His conservative treatment is now been for multiple years.  At this point his bilateral knee pain is detrimentally affected his activity living, his quality of life, and his mobility.  HPI  Review of Systems He denies any active medical problems.  He is not a smoker and not a diabetic.  He currently denies any headache, chest pain, shortness of breath, fever, chills, nausea, vomiting.  Objective: Vital Signs: There were no vitals taken for this visit.  Physical Exam He is alert and oriented x3 and in no acute distress Ortho Exam Examination of both knees show significant varus malalignment.  There is no significant effusion of either knee.  Both knees have full range of motion and are ligamentously stable.  Both knees have significant medial joint line tenderness as well as significant patellofemoral crepitation.  Both knees are painful throughout the arc of motion. Specialty Comments:  No specialty comments available.  Imaging: Xr Knee 1-2 Views Right  Result Date: 10/29/2018 3 views of the right knee show severe tricompartmental arthritic changes.  There is varus malalignment of the knee.  There are large para-articular osteophytes in all 3 compartments.  There is almost complete loss the medial joint space.  There is also sclerotic and cystic changes.  Xr Knee 3 View Left  Result Date: 10/29/2018 3 views of the left knee show severe end-stage tricompartmental arthritic changes.  There are particular ossified's in all 3 compartments.  There is significant varus malalignment  and complete loss of the medial joint space.    PMFS History: Patient Active Problem List   Diagnosis Date Noted  . Unilateral primary osteoarthritis, left knee 10/29/2018  . Unilateral primary osteoarthritis, right knee 10/29/2018  . Umbilical hernia 43/15/4008  . Strangulated umbilical hernia 67/61/9509   Past Medical History:  Diagnosis Date  . Diverticulitis   . Hernia of abdominal cavity   . Osteoarthritis    knees  . Sleep apnea     History reviewed. No pertinent family history.  Past Surgical History:  Procedure Laterality Date  . KNEE ARTHROSCOPY    . UMBILICAL HERNIA REPAIR N/A 11/29/2017   Procedure: HERNIA REPAIR UMBILICAL ADULT;  Surgeon: Kinsinger, Arta Bruce, MD;  Location: [facility];  Service: General;  Laterality: N/A;   Social History   Occupational History  . Not on file  Tobacco Use  . Smoking status: Never Smoker  . Smokeless tobacco: Never Used  Substance and Sexual Activity  . Alcohol use: Not on file  . Drug use: Not on file  . Sexual activity: Not on file

## 2018-10-30 DIAGNOSIS — G4733 Obstructive sleep apnea (adult) (pediatric): Secondary | ICD-10-CM | POA: Diagnosis not present

## 2018-11-10 ENCOUNTER — Telehealth (INDEPENDENT_AMBULATORY_CARE_PROVIDER_SITE_OTHER): Payer: Self-pay | Admitting: Orthopaedic Surgery

## 2018-11-10 MED ORDER — TRAMADOL HCL 50 MG PO TABS: 50.0000 mg | ORAL_TABLET | Freq: Four times a day (QID) | ORAL | 0 refills | Status: DC | PRN

## 2018-11-10 NOTE — Telephone Encounter (Signed)
Pharmacy  [facility] [place]    Medication needed Tramadol  Patient called he stated  he didn't receive medicine after his appointment

## 2018-11-10 NOTE — Telephone Encounter (Signed)
Please advise 

## 2018-11-10 NOTE — Telephone Encounter (Signed)
I'll send some in. 

## 2019-01-03 DIAGNOSIS — J111 Influenza due to unidentified influenza virus with other respiratory manifestations: Secondary | ICD-10-CM | POA: Diagnosis not present

## 2019-01-06 ENCOUNTER — Other Ambulatory Visit: Payer: Self-pay

## 2019-01-06 ENCOUNTER — Inpatient Hospital Stay (HOSPITAL_BASED_OUTPATIENT_CLINIC_OR_DEPARTMENT_OTHER)
Admission: EM | Admit: 2019-01-06 | Discharge: 2019-01-12 | DRG: 442 | Disposition: A | Discharge status: Home / Self Care | Payer: BLUE CROSS/BLUE SHIELD | Attending: Internal Medicine | Admitting: Internal Medicine

## 2019-01-06 ENCOUNTER — Encounter (HOSPITAL_BASED_OUTPATIENT_CLINIC_OR_DEPARTMENT_OTHER): Payer: Self-pay | Admitting: Emergency Medicine

## 2019-01-06 ENCOUNTER — Emergency Department (HOSPITAL_BASED_OUTPATIENT_CLINIC_OR_DEPARTMENT_OTHER): Payer: BLUE CROSS/BLUE SHIELD

## 2019-01-06 DIAGNOSIS — M79609 Pain in unspecified limb: Secondary | ICD-10-CM | POA: Diagnosis not present

## 2019-01-06 DIAGNOSIS — M170 Bilateral primary osteoarthritis of knee: Secondary | ICD-10-CM | POA: Diagnosis present

## 2019-01-06 DIAGNOSIS — R1110 Vomiting, unspecified: Secondary | ICD-10-CM | POA: Diagnosis not present

## 2019-01-06 DIAGNOSIS — D509 Iron deficiency anemia, unspecified: Secondary | ICD-10-CM | POA: Diagnosis present

## 2019-01-06 DIAGNOSIS — K7460 Unspecified cirrhosis of liver: Secondary | ICD-10-CM | POA: Diagnosis not present

## 2019-01-06 DIAGNOSIS — E860 Dehydration: Secondary | ICD-10-CM | POA: Diagnosis not present

## 2019-01-06 DIAGNOSIS — R630 Anorexia: Secondary | ICD-10-CM | POA: Diagnosis not present

## 2019-01-06 DIAGNOSIS — Z888 Allergy status to other drugs, medicaments and biological substances status: Secondary | ICD-10-CM | POA: Diagnosis not present

## 2019-01-06 DIAGNOSIS — Z79891 Long term (current) use of opiate analgesic: Secondary | ICD-10-CM | POA: Diagnosis not present

## 2019-01-06 DIAGNOSIS — R197 Diarrhea, unspecified: Secondary | ICD-10-CM | POA: Diagnosis not present

## 2019-01-06 DIAGNOSIS — I81 Portal vein thrombosis: Secondary | ICD-10-CM | POA: Diagnosis not present

## 2019-01-06 DIAGNOSIS — E871 Hypo-osmolality and hyponatremia: Secondary | ICD-10-CM | POA: Diagnosis present

## 2019-01-06 DIAGNOSIS — G4733 Obstructive sleep apnea (adult) (pediatric): Secondary | ICD-10-CM | POA: Diagnosis not present

## 2019-01-06 DIAGNOSIS — Y92239 Unspecified place in hospital as the place of occurrence of the external cause: Secondary | ICD-10-CM | POA: Diagnosis not present

## 2019-01-06 DIAGNOSIS — R509 Fever, unspecified: Secondary | ICD-10-CM | POA: Diagnosis not present

## 2019-01-06 DIAGNOSIS — Z881 Allergy status to other antibiotic agents status: Secondary | ICD-10-CM | POA: Diagnosis not present

## 2019-01-06 DIAGNOSIS — T45515A Adverse effect of anticoagulants, initial encounter: Secondary | ICD-10-CM | POA: Diagnosis not present

## 2019-01-06 DIAGNOSIS — R000 Tachycardia, unspecified: Secondary | ICD-10-CM | POA: Diagnosis not present

## 2019-01-06 DIAGNOSIS — K769 Liver disease, unspecified: Secondary | ICD-10-CM | POA: Diagnosis not present

## 2019-01-06 DIAGNOSIS — J101 Influenza due to other identified influenza virus with other respiratory manifestations: Secondary | ICD-10-CM | POA: Diagnosis present

## 2019-01-06 DIAGNOSIS — K5730 Diverticulosis of large intestine without perforation or abscess without bleeding: Secondary | ICD-10-CM | POA: Diagnosis not present

## 2019-01-06 DIAGNOSIS — K5732 Diverticulitis of large intestine without perforation or abscess without bleeding: Secondary | ICD-10-CM | POA: Diagnosis present

## 2019-01-06 DIAGNOSIS — E861 Hypovolemia: Secondary | ICD-10-CM | POA: Diagnosis not present

## 2019-01-06 DIAGNOSIS — K5792 Diverticulitis of intestine, part unspecified, without perforation or abscess without bleeding: Secondary | ICD-10-CM | POA: Diagnosis not present

## 2019-01-06 DIAGNOSIS — Z79899 Other long term (current) drug therapy: Secondary | ICD-10-CM

## 2019-01-06 DIAGNOSIS — Z91030 Bee allergy status: Secondary | ICD-10-CM

## 2019-01-06 LAB — INFLUENZA PANEL BY PCR (TYPE A & B)
Influenza A By PCR: NEGATIVE
Influenza B By PCR: NEGATIVE

## 2019-01-06 LAB — CBC WITH DIFFERENTIAL/PLATELET
ABS IMMATURE GRANULOCYTES: 0.28 10*3/uL — AB (ref 0.00–0.07)
BASOS PCT: 0 %
Basophils Absolute: 0 10*3/uL (ref 0.0–0.1)
Eosinophils Absolute: 0 10*3/uL (ref 0.0–0.5)
Eosinophils Relative: 0 %
HEMATOCRIT: 41.6 % (ref 39.0–52.0)
Hemoglobin: 13.2 g/dL (ref 13.0–17.0)
IMMATURE GRANULOCYTES: 2 %
LYMPHS ABS: 0.5 10*3/uL — AB (ref 0.7–4.0)
Lymphocytes Relative: 3 %
MCH: 26 pg (ref 26.0–34.0)
MCHC: 31.7 g/dL (ref 30.0–36.0)
MCV: 81.9 fL (ref 80.0–100.0)
MONOS PCT: 7 %
Monocytes Absolute: 1.3 10*3/uL — ABNORMAL HIGH (ref 0.1–1.0)
NEUTROS ABS: 16 10*3/uL — AB (ref 1.7–7.7)
NEUTROS PCT: 88 %
PLATELETS: 276 10*3/uL (ref 150–400)
RBC: 5.08 MIL/uL (ref 4.22–5.81)
RDW: 13.7 % (ref 11.5–15.5)
WBC: 18.1 10*3/uL — ABNORMAL HIGH (ref 4.0–10.5)
nRBC: 0 % (ref 0.0–0.2)

## 2019-01-06 LAB — HEPARIN LEVEL (UNFRACTIONATED): Heparin Unfractionated: 0.19 IU/mL — ABNORMAL LOW (ref 0.30–0.70)

## 2019-01-06 LAB — COMPREHENSIVE METABOLIC PANEL
ALBUMIN: 3.3 g/dL — AB (ref 3.5–5.0)
ALT: 26 U/L (ref 0–44)
AST: 23 U/L (ref 15–41)
Alkaline Phosphatase: 91 U/L (ref 38–126)
Anion gap: 10 (ref 5–15)
BUN: 10 mg/dL (ref 6–20)
CHLORIDE: 97 mmol/L — AB (ref 98–111)
CO2: 25 mmol/L (ref 22–32)
CREATININE: 1 mg/dL (ref 0.61–1.24)
Calcium: 8.6 mg/dL — ABNORMAL LOW (ref 8.9–10.3)
GFR calc Af Amer: >60 mL/min (ref >60)
GLUCOSE: 142 mg/dL — AB (ref 70–99)
Potassium: 3.5 mmol/L (ref 3.5–5.1)
Sodium: 132 mmol/L — ABNORMAL LOW (ref 135–145)
Total Bilirubin: 1.1 mg/dL (ref 0.3–1.2)
Total Protein: 8.3 g/dL — ABNORMAL HIGH (ref 6.5–8.1)

## 2019-01-06 LAB — POCT I-STAT EG7
ACID-BASE EXCESS: 3 mmol/L — AB (ref 0.0–2.0)
Bicarbonate: 28.1 mmol/L — ABNORMAL HIGH (ref 20.0–28.0)
Calcium, Ion: 1.05 mmol/L — ABNORMAL LOW (ref 1.15–1.40)
HCT: 35 % — ABNORMAL LOW (ref 39.0–52.0)
HEMOGLOBIN: 11.9 g/dL — AB (ref 13.0–17.0)
O2 Saturation: 38 %
POTASSIUM: 3.7 mmol/L (ref 3.5–5.1)
SODIUM: 135 mmol/L (ref 135–145)
TCO2: 29 mmol/L (ref 22–32)
pCO2, Ven: 47.2 mmHg (ref 44.0–60.0)
pH, Ven: 7.393 (ref 7.250–7.430)
pO2, Ven: 25 mmHg — CL (ref 32.0–45.0)

## 2019-01-06 LAB — LIPASE, BLOOD: LIPASE: 22 U/L (ref 11–51)

## 2019-01-06 MED ORDER — PIPERACILLIN-TAZOBACTAM 3.375 G IVPB 30 MIN
3.3750 g | Freq: Once | INTRAVENOUS | Status: AC
  Administered 2019-01-06: 3.375 g via INTRAVENOUS
  Filled 2019-01-06 (×2): qty 50

## 2019-01-06 MED ORDER — HYDROMORPHONE HCL 1 MG/ML IJ SOLN
1.0000 mg | INTRAMUSCULAR | Status: DC | PRN
  Administered 2019-01-06 – 2019-01-08 (×7): 1 mg via INTRAVENOUS
  Filled 2019-01-06 (×7): qty 1

## 2019-01-06 MED ORDER — ONDANSETRON HCL 4 MG/2ML IJ SOLN
4.0000 mg | Freq: Once | INTRAMUSCULAR | Status: AC
  Administered 2019-01-06: 4 mg via INTRAVENOUS
  Filled 2019-01-06: qty 2

## 2019-01-06 MED ORDER — ONDANSETRON HCL 4 MG PO TABS: 4.0000 mg | ORAL_TABLET | Freq: Four times a day (QID) | ORAL | Status: DC | PRN

## 2019-01-06 MED ORDER — MORPHINE SULFATE (PF) 4 MG/ML IV SOLN
4.0000 mg | Freq: Once | INTRAVENOUS | Status: AC
  Administered 2019-01-06: 4 mg via INTRAVENOUS
  Filled 2019-01-06: qty 1

## 2019-01-06 MED ORDER — HEPARIN BOLUS VIA INFUSION
3000.0000 [IU] | Freq: Once | INTRAVENOUS | Status: AC
  Administered 2019-01-07: 3000 [IU] via INTRAVENOUS
  Filled 2019-01-06: qty 3000

## 2019-01-06 MED ORDER — SODIUM CHLORIDE 0.9 % IV BOLUS
1000.0000 mL | Freq: Once | INTRAVENOUS | Status: AC
  Administered 2019-01-06: 1000 mL via INTRAVENOUS

## 2019-01-06 MED ORDER — DOCUSATE SODIUM 100 MG PO CAPS
100.0000 mg | ORAL_CAPSULE | Freq: Two times a day (BID) | ORAL | Status: DC | PRN
  Administered 2019-01-11: 100 mg via ORAL
  Filled 2019-01-06: qty 1

## 2019-01-06 MED ORDER — HEPARIN (PORCINE) 25000 UT/250ML-% IV SOLN
3000.0000 [IU]/h | INTRAVENOUS | Status: DC
  Administered 2019-01-06: 1650 [IU]/h via INTRAVENOUS
  Administered 2019-01-07: 1850 [IU]/h via INTRAVENOUS
  Administered 2019-01-07: 2450 [IU]/h via INTRAVENOUS
  Administered 2019-01-07: 2150 [IU]/h via INTRAVENOUS
  Administered 2019-01-08: 3000 [IU]/h via INTRAVENOUS
  Administered 2019-01-08: 2600 [IU]/h via INTRAVENOUS
  Filled 2019-01-06 (×6): qty 250

## 2019-01-06 MED ORDER — OSELTAMIVIR PHOSPHATE 75 MG PO CAPS
75.0000 mg | ORAL_CAPSULE | Freq: Two times a day (BID) | ORAL | Status: DC
  Filled 2019-01-06 (×2): qty 1

## 2019-01-06 MED ORDER — HEPARIN BOLUS VIA INFUSION
5500.0000 [IU] | Freq: Once | INTRAVENOUS | Status: AC
  Administered 2019-01-06: 5500 [IU] via INTRAVENOUS

## 2019-01-06 MED ORDER — SODIUM CHLORIDE 0.9 % IV SOLN
INTRAVENOUS | Status: DC | PRN
  Administered 2019-01-06: 250 mL via INTRAVENOUS

## 2019-01-06 MED ORDER — MAGNESIUM CITRATE PO SOLN: 1.0000 | ORAL | Status: DC | PRN

## 2019-01-06 MED ORDER — IOPAMIDOL (ISOVUE-300) INJECTION 61%
100.0000 mL | Freq: Once | INTRAVENOUS | Status: AC | PRN
  Administered 2019-01-06: 100 mL via INTRAVENOUS

## 2019-01-06 MED ORDER — PIPERACILLIN-TAZOBACTAM 3.375 G IVPB
3.3750 g | Freq: Three times a day (TID) | INTRAVENOUS | Status: DC
  Administered 2019-01-06 – 2019-01-07 (×2): 3.375 g via INTRAVENOUS
  Filled 2019-01-06 (×3): qty 50

## 2019-01-06 MED ORDER — ONDANSETRON HCL 4 MG/2ML IJ SOLN: 4.0000 mg | Freq: Four times a day (QID) | INTRAMUSCULAR | Status: DC | PRN

## 2019-01-06 MED ORDER — TRAMADOL HCL 50 MG PO TABS
50.0000 mg | ORAL_TABLET | Freq: Four times a day (QID) | ORAL | Status: DC | PRN
  Administered 2019-01-11 – 2019-01-12 (×2): 50 mg via ORAL
  Filled 2019-01-06 (×2): qty 1

## 2019-01-06 MED ORDER — SODIUM CHLORIDE 0.9 % IV SOLN
INTRAVENOUS | Status: DC
  Administered 2019-01-06 – 2019-01-09 (×6): via INTRAVENOUS

## 2019-01-06 MED ORDER — ACETAMINOPHEN 500 MG PO TABS
1000.0000 mg | ORAL_TABLET | Freq: Once | ORAL | Status: AC
  Administered 2019-01-06: 1000 mg via ORAL
  Filled 2019-01-06: qty 2

## 2019-01-06 NOTE — ED Notes (Addendum)
ED Provider at bedside discussing test results and updating plan of care.

## 2019-01-06 NOTE — Progress Notes (Signed)
Pharmacy Antibiotic Note  Marc May is a 42 y.o. male admitted on 01/06/2019 with worsening abdominal pain, vomiting, diarrhea.  Found to have acute on chronic diverticulitis flare and portal vein thrombosis.  Pharmacy has been consulted for Zosyn dosing.  Pt received a dose of Zosyn in the ED ~ 1430.  Tm 102.3.  WBC 18.1  Plan: Zosyn 3.375 gm IV q8h (4 hour infusion). Anticipate no further adjustments needed based on renal fxn.   Rx will sign off. Suggest d/c Tamiflu as PCR negative.  Height: 5\' 9"  (175.3 cm) Weight: 250 lb (113.4 kg) IBW/kg (Calculated) : 70.7  Temp (24hrs), Avg:101.1 F (38.4 C), Min:99.7 F (37.6 C), Max:102.6 F (39.2 C)  Recent Labs  Lab 01/06/19 1127  WBC 18.1*  CREATININE 1.00    Estimated Creatinine Clearance: 119.5 mL/min (by C-G formula based on SCr of 1 mg/dL).    Allergies  Allergen Reactions  . Other Anaphylaxis    ANTIBIOTIC; pt does not remember which one  . Banana   . Bee Venom     Thank you for allowing pharmacy to be a part of this patient's care.  [employer], Pharm.D., BCPS Clinical Pharmacist  **Pharmacist phone directory can now be found on [email or web address] ([email or web address]).  Listed under [place].  01/06/2019 7:44 PM

## 2019-01-06 NOTE — ED Notes (Signed)
ED Provider at bedside. 

## 2019-01-06 NOTE — Progress Notes (Signed)
ANTICOAGULATION CONSULT NOTE  Pharmacy Consult for Heparin Indication: portal vein thrombosis  Heparin Dosing Weight: 95.9 kg  Labs: Recent Labs    01/06/19 1127 01/06/19 1323 01/06/19 2157  HGB 13.2 11.9*  --   HCT 41.6 35.0*  --   PLT 276  --   --   HEPARINUNFRC  --   --  0.19*  CREATININE 1.00  --   --     Assessment: 28 yom presenting with worsening abdominal pain, vomiting, diarrhea despite treatment for influenza x 2 days with Tamiflu. Patient found to have portal vein thrombosis. Pharmacy consulted to dose heparin. Not on anticoagulation PTA. Hg 11.9, plt wnl. No active bleed issues documented.  2/25 PM update: initial heparin level below goal  Goal of Therapy:  Heparin level 0.3-0.7 units/ml Monitor platelets by anticoagulation protocol: Yes   Plan:  Re-bolus heparin 3000 units/hr Inc heparin drip to 1850 units/hr Re-check heparin level in 6-8 hours  Narda Bonds, PharmD, [place] Pharmacist Phone: [phone]

## 2019-01-06 NOTE — Progress Notes (Signed)
Patient's home CPAP given visual inspection for use.  Unit clean with no power cord damage seen.  OK to use.

## 2019-01-06 NOTE — ED Triage Notes (Signed)
Abdominal pain x5 days with vomiting and diarrhea. Diagnosed with the flu at UC 2 days ago and started Tamiflu.  Sx worse after taking Tamiflu.  Accidentally ingested Rubbing Alcohol 2 nights ago.  Was in contact with Poison Control and they were not concerned.  Abd pain is severe. Pt sts he has hx of Diverticulitis.  Pt is sweating and somewhat pale.

## 2019-01-06 NOTE — ED Provider Notes (Signed)
[facility] Provider Note   CSN: [id number] Arrival date & time: 01/06/19  1057    History   Chief Complaint Chief Complaint  Patient presents with  . Abdominal Pain    HPI Marc May is a 43 y.o. male.     HPI   43 year old male with history of diverticulitis presents with concern for fever, abdominal pain, nausea, vomiting, diarrhea, body aches for 5 days. Reports he went to urgent care 2 days ago where flu test was negative (unclear what type of testing) but felt clinically he had flu and was given tamiflu.  The symptoms have continued since starting the tamiflu.  WHen he was feeling ill, his wife was using rubbing alcohol to try to cool down his forehead, and left the bottle next to him. Reports he went to drink a glass of water and accidentally grabbed the bottle of rubbing alcohol and began drinking that, "chugging it" before he realized it was not water and was rubbing alcohol.  This occurred 3 days ago.   Had a cough all of last week but it is getting better now.    Past Medical History:  Diagnosis Date  . Diverticulitis   . Hernia of abdominal cavity   . Osteoarthritis    knees  . Sleep apnea     Patient Active Problem List   Diagnosis Date Noted  . Portal vein thrombosis 01/06/2019  . Acute diverticulitis 01/06/2019  . Hyponatremia 01/06/2019  . Unilateral primary osteoarthritis, left knee 10/29/2018  . Unilateral primary osteoarthritis, right knee 10/29/2018  . Umbilical hernia 54/27/0623  . Strangulated umbilical hernia 76/28/3151    Past Surgical History:  Procedure Laterality Date  . KNEE ARTHROSCOPY    . UMBILICAL HERNIA REPAIR N/A 11/29/2017   Procedure: HERNIA REPAIR UMBILICAL ADULT;  Surgeon: Kinsinger, Arta Bruce, MD;  Location: [facility];  Service: General;  Laterality: N/A;        Home Medications    Prior to Admission medications   Medication Sig Start Date End Date Taking? Authorizing Provider  docusate sodium  (COLACE) 100 MG capsule Take 100 mg by mouth 2 (two) times daily as needed for mild constipation.   Yes [provider]  oseltamivir (TAMIFLU) 75 MG capsule Take 75 mg by mouth 2 (two) times daily.   Yes [provider]  magnesium citrate SOLN Take 1 Bottle by mouth as needed for severe constipation.    [provider]  oxyCODONE (OXY IR/ROXICODONE) 5 MG immediate release tablet Take 1-2 tablets (5-10 mg total) by mouth every 4 (four) hours as needed for moderate pain. Patient not taking: Reported on 10/29/2018 11/30/17   Ralene Ok, MD  traMADol (ULTRAM) 50 MG tablet Take 1-2 tablets (50-100 mg total) by mouth every 6 (six) hours as needed. 11/10/18   Mcarthur Rossetti, MD    Family History No family history on file.  Social History Social History   Tobacco Use  . Smoking status: Never Smoker  . Smokeless tobacco: Never Used  Substance Use Topics  . Alcohol use: Not Currently  . Drug use: Never     Allergies   Other; Banana; and Bee venom   Review of Systems Review of Systems  Constitutional: Positive for fever.  HENT: Negative for sore throat.   Eyes: Negative for visual disturbance.  Respiratory: Negative for cough (reports had all of last week but improved now) and shortness of breath.   Cardiovascular: Negative for chest pain.  Gastrointestinal:  Positive for abdominal pain, diarrhea, nausea and vomiting.  Genitourinary: Negative for difficulty urinating.  Musculoskeletal: Positive for myalgias. Negative for back pain and neck stiffness.  Skin: Negative for rash.  Neurological: Negative for syncope and headaches.     Physical Exam Updated Vital Signs BP 115/73   Pulse 100   Temp (!) 100.6 F (38.1 C) (Oral)   Resp (!) 24   Ht 5\' 9"  (1.753 m)   Wt 113.4 kg   SpO2 96%   BMI 36.92 kg/m   Physical Exam Vitals signs and nursing note reviewed.  Constitutional:      General: He is not in acute distress.    Appearance: He is  well-developed. He is not diaphoretic.  HENT:     Head: Normocephalic and atraumatic.  Eyes:     Conjunctiva/sclera: Conjunctivae normal.  Neck:     Musculoskeletal: Normal range of motion.  Cardiovascular:     Rate and Rhythm: Regular rhythm. Tachycardia present.     Heart sounds: Normal heart sounds. No murmur. No friction rub. No gallop.   Pulmonary:     Effort: Pulmonary effort is normal. No respiratory distress.     Breath sounds: Normal breath sounds. No wheezing or rales.  Abdominal:     General: There is no distension.     Palpations: Abdomen is soft.     Tenderness: There is generalized abdominal tenderness and tenderness in the left lower quadrant. There is no guarding.  Skin:    General: Skin is warm and dry.  Neurological:     Mental Status: He is alert and oriented to person, place, and time.      ED Treatments / Results  Labs (all labs ordered are listed, but only abnormal results are displayed) Labs Reviewed  CBC WITH DIFFERENTIAL/PLATELET - Abnormal; Notable for the following components:      Result Value   WBC 18.1 (*)    Neutro Abs 16.0 (*)    Lymphs Abs 0.5 (*)    Monocytes Absolute 1.3 (*)    Abs Immature Granulocytes 0.28 (*)    All other components within normal limits  COMPREHENSIVE METABOLIC PANEL - Abnormal; Notable for the following components:   Sodium 132 (*)    Chloride 97 (*)    Glucose, Bld 142 (*)    Calcium 8.6 (*)    Total Protein 8.3 (*)    Albumin 3.3 (*)    All other components within normal limits  POCT I-STAT EG7 - Abnormal; Notable for the following components:   pO2, Ven 25.0 (*)    Bicarbonate 28.1 (*)    Acid-Base Excess 3.0 (*)    Calcium, Ion 1.05 (*)    HCT 35.0 (*)    Hemoglobin 11.9 (*)    All other components within normal limits  LIPASE, BLOOD  INFLUENZA PANEL BY PCR (TYPE A & B)  HEPARIN LEVEL (UNFRACTIONATED)  HEPARIN LEVEL (UNFRACTIONATED)  CBC  HIV ANTIBODY (ROUTINE TESTING W REFLEX)  CBC  COMPREHENSIVE  METABOLIC PANEL  I-STAT VENOUS BLOOD GAS, ED    EKG EKG Interpretation  Date/Time:  Tuesday January 06 2019 11:38:15 EST Ventricular Rate:  115 PR Interval:    QRS Duration: 95 QT Interval:  305 QTC Calculation: 422 R Axis:   5 Text Interpretation:  Sinus tachycardia Abnormal R-wave progression, early transition No previous ECGs available Confirmed by Gareth Morgan [phone]) on 01/06/2019 1:15:48 PM   Radiology Ct Abdomen Pelvis W Contrast  Result Date: 01/06/2019 CLINICAL DATA:  43 year old  male with a history of abdominal pain vomiting and diarrhea EXAM: CT ABDOMEN AND PELVIS WITH CONTRAST TECHNIQUE: Multidetector CT imaging of the abdomen and pelvis was performed using the standard protocol following bolus administration of intravenous contrast. CONTRAST:  143mL ISOVUE-300 IOPAMIDOL (ISOVUE-300) INJECTION 61% COMPARISON:  11/29/2017 FINDINGS: Lower chest: No acute abnormality. Hepatobiliary: Diffusely decreased attenuation of liver parenchyma. The branching hypodensity of the right liver is thrombosis of branches of the right portal vein. Unremarkable gallbladder. Pancreas: Unremarkable Spleen: Unremarkable Adrenals/Urinary Tract: Unremarkable adrenal glands. No evidence of hydronephrosis or nephrolithiasis. Unremarkable course the bilateral ureters. Unremarkable urinary bladder. Stomach/Bowel: Unremarkable stomach. No transition point of small bowel. Contrast traverses small bowel and reaches the colon. Normal appendix. No colonic obstruction. Redemonstration of diverticular change of the sigmoid colon. Short segment of sigmoid colon demonstrates circumferential wall thickening with inflammatory changes of the adjacent fat, thickening of the fascia, and some small local lymph nodes. No evidence of abscess or perforation. Vascular/Lymphatic: No significant atherosclerotic change. No retroperitoneal adenopathy. Small lymph nodes of the sigmoid colon mesentery. Reproductive: Calcifications of  the prostate. Other: Edema in the abdominal fat adjacent to transverse colon. Musculoskeletal: Degenerative changes of the thoracolumbar spine. No displaced fracture. IMPRESSION: Partial right portal vein thrombosis. Circumferential wall thickening of the sigmoid colon in a region of diverticular change. This may represent acute on chronic diverticulitis. Colon cancer can not be excluded, and referral for GI evaluation and colonoscopy is recommended. Liver steatosis. These results were discussed by telephone at the time of interpretation on 01/06/2019 at 2:12 pm with Dr. Gareth Morgan. Electronically Signed   By: Corrie Mckusick D.O.   On: 01/06/2019 14:13    Procedures Procedures (including critical care time)  Medications Ordered in ED Medications  0.9 %  sodium chloride infusion (250 mLs Intravenous New Bag/Given 01/06/19 1547)  heparin ADULT infusion 100 units/mL (25000 units/258mL sodium chloride 0.45%) (1,650 Units/hr Intravenous New Bag/Given 01/06/19 1443)  docusate sodium (COLACE) capsule 100 mg (has no administration in time range)  magnesium citrate solution 1 Bottle (has no administration in time range)  traMADol (ULTRAM) tablet 50-100 mg (has no administration in time range)  oseltamivir (TAMIFLU) capsule 75 mg (has no administration in time range)  0.9 %  sodium chloride infusion ( Intravenous New Bag/Given 01/06/19 1958)  ondansetron (ZOFRAN) tablet 4 mg (has no administration in time range)    Or  ondansetron (ZOFRAN) injection 4 mg (has no administration in time range)  piperacillin-tazobactam (ZOSYN) IVPB 3.375 g (has no administration in time range)  sodium chloride 0.9 % bolus 1,000 mL (0 mLs Intravenous Stopped 01/06/19 1251)  ondansetron (ZOFRAN) injection 4 mg (4 mg Intravenous Given 01/06/19 1136)  morphine 4 MG/ML injection 4 mg (4 mg Intravenous Given 01/06/19 1137)  morphine 4 MG/ML injection 4 mg (4 mg Intravenous Given 01/06/19 1314)  iopamidol (ISOVUE-300) 61 % injection  100 mL (100 mLs Intravenous Contrast Given 01/06/19 1343)  acetaminophen (TYLENOL) tablet 1,000 mg (1,000 mg Oral Given 01/06/19 1418)  piperacillin-tazobactam (ZOSYN) IVPB 3.375 g ( Intravenous Stopped 01/06/19 1505)  heparin bolus via infusion 5,500 Units (5,500 Units Intravenous Bolus from Bag 01/06/19 1444)  morphine 4 MG/ML injection 4 mg (4 mg Intravenous Given 01/06/19 1717)     Initial Impression / Assessment and Plan / ED Course  I have reviewed the triage vital signs and the nursing notes.  Pertinent labs & imaging results that were available during my care of the patient were reviewed by me and considered in my  medical decision making (see chart for details).        43 year old male with history of diverticulitis presents with concern for fever, abdominal pain, nausea, vomiting, diarrhea, body aches for 5 days.  Pt also reports accidental ingestion of rubbing alcohol, has no signs of other toxic etoh ingestion, no complications.  Tachycardia on arrival thought to be secondary to dehydration in setting of vomiting and diarrhea.  CT ordered to evaluate for diverticulitis.   CT completed shows acute on chronic diverticulitis and portal venous thrombosis.  Given zosyn, IV fluid, heparin gtt initiated.  Admitted for further care.    Final Clinical Impressions(s) / ED Diagnoses   Final diagnoses:  Diverticulitis  Thrombosis, portal vein    ED Discharge Orders    None       Gareth Morgan, MD 01/06/19 2016

## 2019-01-06 NOTE — Progress Notes (Signed)
New Admission Note:   Arrival Method: Bed  PTAR  Mental Orientation: Alert and oriented  Telemetry: none Assessment: Completed Skin: intact  YH:NPMV ac heparin 16.5  Pain:8/10 abdomen radiate to lower left side  Tubes: Safety Measures: Safety Fall Prevention Plan has been given, discussed and signed Admission: Completed [facility] Orientation: Patient has been orientated to the room, unit and staff.  Family: In-laws   Orders have been reviewed and implemented. Will continue to monitor the patient. Call light has been placed within reach and bed alarm has been activated.   Paislyn Domenico RN [facility] Phone: [phone]

## 2019-01-06 NOTE — Progress Notes (Signed)
[facility] to [facility] transfer  43 y/o M with no sig PMH presented to ED with several days of flulike symptoms.  He had been started on Tamiflu for clinically diagnosed flu despite a negative PCR.  Came to the ED today with abdominal pain, nausea, diarrhea, fever, body aches.  Temperature up to 102.  Hemodynamically stable.  Leukocytosis.  Found to have portal vein thrombosis and acute on chronic diverticulitis.  Started on Zosyn, heparin drip.  Unclear why patient has portal vein thrombosis.  No history of cirrhosis.  Radiologist felt that possibly chronic diverticulitis could be causing liver seeding but there was no liver abscess.  Admit to [place], inpatient

## 2019-01-06 NOTE — H&P (Signed)
History and Physical   Marc May TOI:[id number] DOB: [birth year] DOA: 01/06/2019  Referring MD/NP/PA: Dr. Billy Fischer  PCP: [facility]   Outpatient Specialists: None  Patient coming from: Home  Chief Complaint: Abdominal pain  HPI: Marc May is a 43 y.o. male with medical history significant of diverticular disease, obstructive sleep apnea, osteoarthritis who presented to [facility] with abdominal pain nausea vomiting and diarrhea for 5 days.  He was seen in urgent care center 2 days ago with negative flu test.  Patient was initially placed empirically on Tamiflu.  He came back to the ER today still having fever chills and abdominal pain.  He was also having nausea.  Work-up done including CT scan showed acute diverticulitis.  Also patient's was found to have portal vein thrombosis.  He has had recent accidental intake of rubbing alcohol thinking it was water.  He has been having cough also.  Patient has no prior history of thromboembolic phenomena.  He reported history of blood clots in his father and grandmother.  Not sure of the extent.  Patient is not alcoholic and has no history of cirrhosis.  Denied any hematochezia or melena..  ED Course: Temperature is at 102.6, blood pressure 151/87, pulse 123, respiratory of 32 and oxygen sat 92% on 2 L. Venous ABG showed pH of 7.393, sodium 132 potassium 3.5 calcium 8.6.  Calcium 1.05.  Flu test is negative.  CT abdomen pelvis shows partial right portal vein thrombosis.  Circumferential wall thickening of the sigmoid colon region of diverticular change.  Suspicious for acute on chronic diverticulitis.  Colon cancer cannot be excluded and GI evaluation recommended.  Review of Systems: As per HPI otherwise 10 point review of systems negative.    Past Medical History:  Diagnosis Date  . Diverticulitis   . Hernia of abdominal cavity   . Osteoarthritis    knees  . Sleep apnea     Past Surgical  History:  Procedure Laterality Date  . KNEE ARTHROSCOPY    . UMBILICAL HERNIA REPAIR N/A 11/29/2017   Procedure: HERNIA REPAIR UMBILICAL ADULT;  Surgeon: Kinsinger, Arta Bruce, MD;  Location: [place] [facility];  Service: General;  Laterality: N/A;     reports that he has never smoked. He has never used smokeless tobacco. He reports previous alcohol use. He reports that he does not use drugs.  Allergies  Allergen Reactions  . Other Anaphylaxis    ANTIBIOTIC; pt does not remember which one  . Banana   . Bee Venom     No family history on file.   Prior to Admission medications   Medication Sig Start Date End Date Taking? Authorizing Provider  docusate sodium (COLACE) 100 MG capsule Take 100 mg by mouth 2 (two) times daily as needed for mild constipation.   Yes [provider]  oseltamivir (TAMIFLU) 75 MG capsule Take 75 mg by mouth 2 (two) times daily.   Yes [provider]  magnesium citrate SOLN Take 1 Bottle by mouth as needed for severe constipation.    [provider]  oxyCODONE (OXY IR/ROXICODONE) 5 MG immediate release tablet Take 1-2 tablets (5-10 mg total) by mouth every 4 (four) hours as needed for moderate pain. Patient not taking: Reported on 10/29/2018 11/30/17   Ralene Ok, MD  traMADol (ULTRAM) 50 MG tablet Take 1-2 tablets (50-100 mg total) by mouth every 6 (six) hours as needed. 11/10/18   Mcarthur Rossetti, MD    Physical Exam: Vitals:  01/06/19 1600 01/06/19 1608 01/06/19 1800 01/06/19 2026  BP: 115/73   132/88  Pulse: 100   (!) 106  Resp: (!) 24   19  Temp:  (!) 100.6 F (38.1 C)  98.2 F (36.8 C)  TempSrc:  Oral  Oral  SpO2: 96%   96%  Weight:      Height:   5\' 9"  (1.753 m)       Constitutional: NAD, calm, comfortable Vitals:   01/06/19 1600 01/06/19 1608 01/06/19 1800 01/06/19 2026  BP: 115/73   132/88  Pulse: 100   (!) 106  Resp: (!) 24   19  Temp:  (!) 100.6 F (38.1 C)  98.2 F (36.8 C)  TempSrc:  Oral  Oral    SpO2: 96%   96%  Weight:      Height:   5\' 9"  (1.753 m)    Eyes: PERRL, lids and conjunctivae normal ENMT: Mucous membranes are moist. Posterior pharynx clear of any exudate or lesions.Normal dentition.  Neck: normal, supple, no masses, no thyromegaly Respiratory: clear to auscultation bilaterally, no wheezing, no crackles. Normal respiratory effort. No accessory muscle use.  Cardiovascular: Regular rate and rhythm, no murmurs / rubs / gallops. No extremity edema. 2+ pedal pulses. No carotid bruits.  Abdomen: Mild tenderness diffusely, no masses palpated. No hepatosplenomegaly. Bowel sounds positive.  Musculoskeletal: no clubbing / cyanosis. No joint deformity upper and lower extremities. Good ROM, no contractures. Normal muscle tone.  Skin: no rashes, lesions, ulcers. No induration Neurologic: CN 2-12 grossly intact. Sensation intact, DTR normal. Strength 5/5 in all 4.  Psychiatric: Normal judgment and insight. Alert and oriented x 3. Normal mood.     Labs on Admission: I have personally reviewed following labs and imaging studies  CBC: Recent Labs  Lab 01/06/19 1127 01/06/19 1323  WBC 18.1*  --   NEUTROABS 16.0*  --   HGB 13.2 11.9*  HCT 41.6 35.0*  MCV 81.9  --   PLT 276  --    Basic Metabolic Panel: Recent Labs  Lab 01/06/19 1127 01/06/19 1323  NA 132* 135  K 3.5 3.7  CL 97*  --   CO2 25  --   GLUCOSE 142*  --   BUN 10  --   CREATININE 1.00  --   CALCIUM 8.6*  --    GFR: Estimated Creatinine Clearance: 119.5 mL/min (by C-G formula based on SCr of 1 mg/dL). Liver Function Tests: Recent Labs  Lab 01/06/19 1127  AST 23  ALT 26  ALKPHOS 91  BILITOT 1.1  PROT 8.3*  ALBUMIN 3.3*   Recent Labs  Lab 01/06/19 1127  LIPASE 22   No results for input(s): AMMONIA in the last 168 hours. Coagulation Profile: No results for input(s): INR, PROTIME in the last 168 hours. Cardiac Enzymes: No results for input(s): CKTOTAL, CKMB, CKMBINDEX, TROPONINI in the last  168 hours. BNP (last 3 results) No results for input(s): PROBNP in the last 8760 hours. HbA1C: No results for input(s): HGBA1C in the last 72 hours. CBG: No results for input(s): GLUCAP in the last 168 hours. Lipid Profile: No results for input(s): CHOL, HDL, LDLCALC, TRIG, CHOLHDL, LDLDIRECT in the last 72 hours. Thyroid Function Tests: No results for input(s): TSH, T4TOTAL, FREET4, T3FREE, THYROIDAB in the last 72 hours. Anemia Panel: No results for input(s): VITAMINB12, FOLATE, FERRITIN, TIBC, IRON, RETICCTPCT in the last 72 hours. Urine analysis:    Component Value Date/Time   COLORURINE YELLOW 11/28/2017 1710   APPEARANCEUR  CLEAR 11/28/2017 1710   LABSPEC 1.013 11/28/2017 1710   PHURINE 5.0 11/28/2017 1710   GLUCOSEU NEGATIVE 11/28/2017 1710   HGBUR NEGATIVE 11/28/2017 1710   BILIRUBINUR NEGATIVE 11/28/2017 1710   KETONESUR NEGATIVE 11/28/2017 1710   PROTEINUR NEGATIVE 11/28/2017 1710   NITRITE NEGATIVE 11/28/2017 1710   LEUKOCYTESUR NEGATIVE 11/28/2017 1710   Sepsis Labs: @LABRCNTIP (procalcitonin:4,lacticidven:4) )No results found for this or any previous visit (from the past 240 hour(s)).   Radiological Exams on Admission: Ct Abdomen Pelvis W Contrast  Result Date: 01/06/2019 CLINICAL DATA:  43 year old male with a history of abdominal pain vomiting and diarrhea EXAM: CT ABDOMEN AND PELVIS WITH CONTRAST TECHNIQUE: Multidetector CT imaging of the abdomen and pelvis was performed using the standard protocol following bolus administration of intravenous contrast. CONTRAST:  156mL ISOVUE-300 IOPAMIDOL (ISOVUE-300) INJECTION 61% COMPARISON:  11/29/2017 FINDINGS: Lower chest: No acute abnormality. Hepatobiliary: Diffusely decreased attenuation of liver parenchyma. The branching hypodensity of the right liver is thrombosis of branches of the right portal vein. Unremarkable gallbladder. Pancreas: Unremarkable Spleen: Unremarkable Adrenals/Urinary Tract: Unremarkable adrenal glands.  No evidence of hydronephrosis or nephrolithiasis. Unremarkable course the bilateral ureters. Unremarkable urinary bladder. Stomach/Bowel: Unremarkable stomach. No transition point of small bowel. Contrast traverses small bowel and reaches the colon. Normal appendix. No colonic obstruction. Redemonstration of diverticular change of the sigmoid colon. Short segment of sigmoid colon demonstrates circumferential wall thickening with inflammatory changes of the adjacent fat, thickening of the fascia, and some small local lymph nodes. No evidence of abscess or perforation. Vascular/Lymphatic: No significant atherosclerotic change. No retroperitoneal adenopathy. Small lymph nodes of the sigmoid colon mesentery. Reproductive: Calcifications of the prostate. Other: Edema in the abdominal fat adjacent to transverse colon. Musculoskeletal: Degenerative changes of the thoracolumbar spine. No displaced fracture. IMPRESSION: Partial right portal vein thrombosis. Circumferential wall thickening of the sigmoid colon in a region of diverticular change. This may represent acute on chronic diverticulitis. Colon cancer can not be excluded, and referral for GI evaluation and colonoscopy is recommended. Liver steatosis. These results were discussed by telephone at the time of interpretation on 01/06/2019 at 2:12 pm with Dr. Gareth Morgan. Electronically Signed   By: Corrie Mckusick D.O.   On: 01/06/2019 14:13    EKG: Independently reviewed. It shows sinus tach with a rate of 115, nonspecific ST changes  Assessment/Plan Principal Problem:   Portal vein thrombosis Active Problems:   Acute diverticulitis   Hyponatremia   OSA (obstructive sleep apnea)     #1.  Portal vein thrombosis: Has no known history of thromboembolic phenomena.  Not alcoholic no liver cirrhosis.  He may have genetic causes for thromboembolism.  We will initiate heparin.  After couple of months of treatment may need to do coagulation profile to figure out  if he has any genetic preponderance.  Liver has no abscess so I suspect this is not infectious.  We will continue heparin drip and transition to oral anticoagulants at discharge.  #2 acute diverticulitis: Patient probably has chronic diverticulitis on top of that.  IV Zosyn initiated.  Blood cultures have been obtained.  Follow closely.  Clear liquid diet for now.  #3 hyponatremia: Secondary to dehydration.  Continue to hydrate patient.  #4 obstructive sleep apnea: Continue with CPAP at night.   DVT prophylaxis: Heparin drip  Code Status: Full code Family Communication: Wife and mother in the room Disposition Plan: Home Consults called: None Admission status: Inpatient  Severity of Illness: The appropriate patient status for this patient is INPATIENT. Inpatient status  is judged to be reasonable and necessary in order to provide the required intensity of service to ensure the patient's safety. The patient's presenting symptoms, physical exam findings, and initial radiographic and laboratory data in the context of their chronic comorbidities is felt to place them at high risk for further clinical deterioration. Furthermore, it is not anticipated that the patient will be medically stable for discharge from the hospital within 2 midnights of admission. The following factors support the patient status of inpatient.   " The patient's presenting symptoms include abdominal pain nausea vomiting. " The worrisome physical exam findings include distention with abdominal pain. " The initial radiographic and laboratory data are worrisome because of CT scan showed portal vein thrombosis. " The chronic co-morbidities include chronic diverticulitis.   * I certify that at the point of admission it is my clinical judgment that the patient will require inpatient hospital care spanning beyond 2 midnights from the point of admission due to high intensity of service, high risk for further deterioration and high  frequency of surveillance required.Barbette Merino MD [facility] Pager [phone]  If 7PM-7AM, please contact night-coverage [email or web address] Password [facility]  01/06/2019, 9:39 PM

## 2019-01-06 NOTE — ED Notes (Signed)
EDP made aware of elevated temp of 102.6

## 2019-01-06 NOTE — ED Notes (Signed)
VBG reported to Dr. Billy Fischer.

## 2019-01-06 NOTE — Progress Notes (Signed)
[facility] for heparin Indication: portal vein thrombosis  Heparin Dosing Weight: 95.9 kg  Labs: Recent Labs    01/06/19 1127 01/06/19 1323  HGB 13.2 11.9*  HCT 41.6 35.0*  PLT 276  --   CREATININE 1.00  --     Assessment: 43 yom presenting with worsening abdominal pain, vomiting, diarrhea despite treatment for influenza x 2 days with Tamiflu. Patient found to have portal vein thrombosis. Pharmacy consulted to dose heparin. Not on anticoagulation PTA. Hg 11.9, plt wnl. No active bleed issues documented.  Goal of Therapy:  Heparin level 0.3-0.7 units/ml Monitor platelets by anticoagulation protocol: Yes   Plan:  Heparin 5500 unit bolus Start heparin at 1650 units/h 6h heparin level Daily heparin level/CBC Monitor s/sx bleeding F/u anticoagulation plan  Elicia Lamp, PharmD, BCPS Clinical Pharmacist 01/06/2019 2:27 PM

## 2019-01-07 ENCOUNTER — Inpatient Hospital Stay (HOSPITAL_COMMUNITY): Payer: BLUE CROSS/BLUE SHIELD

## 2019-01-07 DIAGNOSIS — E871 Hypo-osmolality and hyponatremia: Secondary | ICD-10-CM

## 2019-01-07 DIAGNOSIS — K5792 Diverticulitis of intestine, part unspecified, without perforation or abscess without bleeding: Secondary | ICD-10-CM

## 2019-01-07 DIAGNOSIS — D509 Iron deficiency anemia, unspecified: Secondary | ICD-10-CM

## 2019-01-07 DIAGNOSIS — G4733 Obstructive sleep apnea (adult) (pediatric): Secondary | ICD-10-CM

## 2019-01-07 LAB — COMPREHENSIVE METABOLIC PANEL
ALT: 23 U/L (ref 0–44)
AST: 20 U/L (ref 15–41)
Albumin: 2.2 g/dL — ABNORMAL LOW (ref 3.5–5.0)
Alkaline Phosphatase: 76 U/L (ref 38–126)
Anion gap: 12 (ref 5–15)
BUN: 6 mg/dL (ref 6–20)
CO2: 25 mmol/L (ref 22–32)
Calcium: 7.8 mg/dL — ABNORMAL LOW (ref 8.9–10.3)
Chloride: 96 mmol/L — ABNORMAL LOW (ref 98–111)
Creatinine, Ser: 1.07 mg/dL (ref 0.61–1.24)
GFR calc Af Amer: >60 mL/min (ref >60)
GFR calc non Af Amer: >60 mL/min (ref >60)
Glucose, Bld: 111 mg/dL — ABNORMAL HIGH (ref 70–99)
Potassium: 3.3 mmol/L — ABNORMAL LOW (ref 3.5–5.1)
Sodium: 133 mmol/L — ABNORMAL LOW (ref 135–145)
Total Bilirubin: 1.3 mg/dL — ABNORMAL HIGH (ref 0.3–1.2)
Total Protein: 7 g/dL (ref 6.5–8.1)

## 2019-01-07 LAB — CBC
HCT: 34.1 % — ABNORMAL LOW (ref 39.0–52.0)
Hemoglobin: 11.3 g/dL — ABNORMAL LOW (ref 13.0–17.0)
MCH: 26.4 pg (ref 26.0–34.0)
MCHC: 33.1 g/dL (ref 30.0–36.0)
MCV: 79.7 fL — ABNORMAL LOW (ref 80.0–100.0)
Platelets: 259 10*3/uL (ref 150–400)
RBC: 4.28 MIL/uL (ref 4.22–5.81)
RDW: 14 % (ref 11.5–15.5)
WBC: 15.8 10*3/uL — ABNORMAL HIGH (ref 4.0–10.5)
nRBC: 0 % (ref 0.0–0.2)

## 2019-01-07 LAB — HEMOGLOBIN A1C
Hgb A1c MFr Bld: 5.8 % — ABNORMAL HIGH (ref 4.8–5.6)
Mean Plasma Glucose: 119.76 mg/dL

## 2019-01-07 LAB — HIV ANTIBODY (ROUTINE TESTING W REFLEX): HIV Screen 4th Generation wRfx: NONREACTIVE

## 2019-01-07 LAB — HEPARIN LEVEL (UNFRACTIONATED)
Heparin Unfractionated: 0.17 IU/mL — ABNORMAL LOW (ref 0.30–0.70)
Heparin Unfractionated: 0.2 IU/mL — ABNORMAL LOW (ref 0.30–0.70)

## 2019-01-07 MED ORDER — SODIUM CHLORIDE 0.9 % IV SOLN
3.0000 g | Freq: Four times a day (QID) | INTRAVENOUS | Status: DC
  Administered 2019-01-07 – 2019-01-12 (×20): 3 g via INTRAVENOUS
  Filled 2019-01-07 (×21): qty 3

## 2019-01-07 MED ORDER — METRONIDAZOLE IN NACL 5-0.79 MG/ML-% IV SOLN: 500.0000 mg | Freq: Three times a day (TID) | INTRAVENOUS | Status: DC

## 2019-01-07 MED ORDER — SODIUM CHLORIDE 0.9 % IV BOLUS
500.0000 mL | Freq: Once | INTRAVENOUS | Status: AC
  Administered 2019-01-07: 500 mL via INTRAVENOUS

## 2019-01-07 MED ORDER — POTASSIUM CHLORIDE CRYS ER 20 MEQ PO TBCR
40.0000 meq | EXTENDED_RELEASE_TABLET | Freq: Two times a day (BID) | ORAL | Status: AC
  Administered 2019-01-07 (×2): 40 meq via ORAL
  Filled 2019-01-07 (×2): qty 2

## 2019-01-07 MED ORDER — IBUPROFEN 400 MG PO TABS
400.0000 mg | ORAL_TABLET | Freq: Once | ORAL | Status: AC
  Administered 2019-01-07: 400 mg via ORAL
  Filled 2019-01-07: qty 1

## 2019-01-07 MED ORDER — HEPARIN BOLUS VIA INFUSION
2000.0000 [IU] | Freq: Once | INTRAVENOUS | Status: AC
  Administered 2019-01-07: 2000 [IU] via INTRAVENOUS
  Filled 2019-01-07: qty 2000

## 2019-01-07 MED ORDER — HEPARIN BOLUS VIA INFUSION
3000.0000 [IU] | Freq: Once | INTRAVENOUS | Status: AC
  Administered 2019-01-07: 3000 [IU] via INTRAVENOUS
  Filled 2019-01-07: qty 3000

## 2019-01-07 MED ORDER — SODIUM CHLORIDE 0.9 % IV SOLN
1.5000 g | Freq: Four times a day (QID) | INTRAVENOUS | Status: DC
  Filled 2019-01-07 (×2): qty 1.5

## 2019-01-07 NOTE — Progress Notes (Addendum)
[facility] PROGRESS NOTE    Progress Note  Marc May  ZOX:[id number] DOB: 02-Nov-1976 DOA: 01/06/2019 PCP: [name]     Brief Narrative:   Marc May is an 43 y.o. male past medical history of diverticular disease, obstructive sleep apnea, osteoarthritis who presented to [facility] with abdominal pain nausea vomiting and diarrhea for 5 days.,  Was recently seen at [facility] and treated for the flu a came back as he was not improving.  The scan showed acute diverticulitis and portal vein thrombosis.  Assessment/Plan:   Acute Portal vein thrombosis No history of thromboembolic event. He denies any alcohol was a seen on CT. Get an abdominal ultrasound, to rule out cirrhosis. Hypercoagulable panel not tested on admission.  Acute diverticulitis Started empirically on IV Zosyn.  He has defervesced leukocytosis improving. We will de-escalate to IV Unasyn and Flagyl. Tolerating clear liquid diet.  Hyponatremia: Likely due to hypovolemia improved with IV fluid hydration. Continue IV fluids.  New microcytic anemia: We will need further evaluation as an outpatient with GI. Get anemia panel in 2 to 3 days.  OSA (obstructive sleep apnea) Cpap. 1A1c    DVT prophylaxis: hepari Family Communication:wife Disposition Plan/Barrier to D/C: home once abd pain improved Code Status:     Code Status Orders  (From admission, onward)         Start     Ordered   01/06/19 1924  Full code  Continuous     01/06/19 1923        Code Status History    Date Active Date Inactive Code Status Order ID Comments User Context   11/29/2017 0808 12/01/2017 0013 Full Code [id number]  Kinsinger, Arta Bruce, MD Inpatient        IV Access:    Peripheral IV   Procedures and diagnostic studies:   Ct Abdomen Pelvis W Contrast  Result Date: 01/06/2019 CLINICAL DATA:  43 year old male with a history of abdominal pain vomiting  and diarrhea EXAM: CT ABDOMEN AND PELVIS WITH CONTRAST TECHNIQUE: Multidetector CT imaging of the abdomen and pelvis was performed using the standard protocol following bolus administration of intravenous contrast. CONTRAST:  146mL ISOVUE-300 IOPAMIDOL (ISOVUE-300) INJECTION 61% COMPARISON:  11/29/2017 FINDINGS: Lower chest: No acute abnormality. Hepatobiliary: Diffusely decreased attenuation of liver parenchyma. The branching hypodensity of the right liver is thrombosis of branches of the right portal vein. Unremarkable gallbladder. Pancreas: Unremarkable Spleen: Unremarkable Adrenals/Urinary Tract: Unremarkable adrenal glands. No evidence of hydronephrosis or nephrolithiasis. Unremarkable course the bilateral ureters. Unremarkable urinary bladder. Stomach/Bowel: Unremarkable stomach. No transition point of small bowel. Contrast traverses small bowel and reaches the colon. Normal appendix. No colonic obstruction. Redemonstration of diverticular change of the sigmoid colon. Short segment of sigmoid colon demonstrates circumferential wall thickening with inflammatory changes of the adjacent fat, thickening of the fascia, and some small local lymph nodes. No evidence of abscess or perforation. Vascular/Lymphatic: No significant atherosclerotic change. No retroperitoneal adenopathy. Small lymph nodes of the sigmoid colon mesentery. Reproductive: Calcifications of the prostate. Other: Edema in the abdominal fat adjacent to transverse colon. Musculoskeletal: Degenerative changes of the thoracolumbar spine. No displaced fracture. IMPRESSION: Partial right portal vein thrombosis. Circumferential wall thickening of the sigmoid colon in a region of diverticular change. This may represent acute on chronic diverticulitis. Colon cancer can not be excluded, and referral for GI evaluation and colonoscopy is recommended. Liver steatosis. These results were discussed by telephone at the time of interpretation on 01/06/2019 at 2:12  pm with Dr. Gareth Morgan. Electronically Signed   By: Corrie Mckusick D.O.   On: 01/06/2019 14:13     Medical Consultants:    None.  Anti-Infectives:   IV flagyl and unasyn  Subjective:    Marc May he continues to have abdominal pain nauseated has not tolerated his diet.  Objective:    Vitals:   01/06/19 1608 01/06/19 1800 01/06/19 2026 01/07/19 0501  BP:   132/88 108/70  Pulse:   (!) 106 93  Resp:   19 18  Temp: (!) 100.6 F (38.1 C)  98.2 F (36.8 C) 98.6 F (37 C)  TempSrc: Oral  Oral Oral  SpO2:   96% 93%  Weight:      Height:  5\' 9"  (1.753 m)      Intake/Output Summary (Last 24 hours) at 01/07/2019 0755 Last data filed at 01/07/2019 0201 Gross per 24 hour  Intake 1752.56 ml  Output 300 ml  Net 1452.56 ml   Filed Weights   01/06/19 1113  Weight: 113.4 kg    Exam: General exam: In no acute distress, obese. Respiratory system: Good air movement and clear to auscultation. Cardiovascular system: S1 & S2 heard, RRR.  Gastrointestinal system: Bowel sounds soft nontender nondistended Central nervous system: Alert and oriented. No focal neurological deficits. Extremities: No pedal edema. Skin: No rashes, lesions or ulcers Psychiatry: Judgement and insight appear normal. Mood & affect appropriate.    Data Reviewed:    Labs: Basic Metabolic Panel: Recent Labs  Lab 01/06/19 1127 01/06/19 1323 01/07/19 0404  NA 132* 135 133*  K 3.5 3.7 3.3*  CL 97*  --  96*  CO2 25  --  25  GLUCOSE 142*  --  111*  BUN 10  --  6  CREATININE 1.00  --  1.07  CALCIUM 8.6*  --  7.8*   GFR Estimated Creatinine Clearance: 111.7 mL/min (by C-G formula based on SCr of 1.07 mg/dL). Liver Function Tests: Recent Labs  Lab 01/06/19 1127 01/07/19 0404  AST 23 20  ALT 26 23  ALKPHOS 91 76  BILITOT 1.1 1.3*  PROT 8.3* 7.0  ALBUMIN 3.3* 2.2*   Recent Labs  Lab 01/06/19 1127  LIPASE 22   No results for input(s): AMMONIA in the last 168 hours. Coagulation  profile No results for input(s): INR, PROTIME in the last 168 hours.  CBC: Recent Labs  Lab 01/06/19 1127 01/06/19 1323 01/07/19 0404  WBC 18.1*  --  15.8*  NEUTROABS 16.0*  --   --   HGB 13.2 11.9* 11.3*  HCT 41.6 35.0* 34.1*  MCV 81.9  --  79.7*  PLT 276  --  259   Cardiac Enzymes: No results for input(s): CKTOTAL, CKMB, CKMBINDEX, TROPONINI in the last 168 hours. BNP (last 3 results) No results for input(s): PROBNP in the last 8760 hours. CBG: No results for input(s): GLUCAP in the last 168 hours. D-Dimer: No results for input(s): DDIMER in the last 72 hours. Hgb A1c: No results for input(s): HGBA1C in the last 72 hours. Lipid Profile: No results for input(s): CHOL, HDL, LDLCALC, TRIG, CHOLHDL, LDLDIRECT in the last 72 hours. Thyroid function studies: No results for input(s): TSH, T4TOTAL, T3FREE, THYROIDAB in the last 72 hours.  Invalid input(s): FREET3 Anemia work up: No results for input(s): VITAMINB12, FOLATE, FERRITIN, TIBC, IRON, RETICCTPCT in the last 72 hours. Sepsis Labs: Recent Labs  Lab 01/06/19 1127 01/07/19 0404  WBC 18.1* 15.8*   Microbiology No results found for  this or any previous visit (from the past 240 hour(s)).   Medications:   . oseltamivir  75 mg Oral BID   Continuous Infusions: . sodium chloride 250 mL (01/06/19 1547)  . sodium chloride 100 mL/hr at 01/06/19 1958  . heparin 1,850 Units/hr (01/07/19 0014)  . piperacillin-tazobactam (ZOSYN)  IV 3.375 g (01/07/19 0521)      LOS: 1 day   Charlynne Cousins  [facility]  01/07/2019, 7:55 AM

## 2019-01-07 NOTE — Progress Notes (Addendum)
[facility] for Heparin Indication: portal vein thrombosis  Heparin Dosing Weight: 95.9 kg  Labs: Recent Labs    01/06/19 1127 01/06/19 1323 01/06/19 2157 01/07/19 0404 01/07/19 0843 01/07/19 1528  HGB 13.2 11.9*  --  11.3*  --   --   HCT 41.6 35.0*  --  34.1*  --   --   PLT 276  --   --  259  --   --   HEPARINUNFRC  --   --  0.19*  --  0.17* 0.20*  CREATININE 1.00  --   --  1.07  --   --     Assessment: 60 yom presenting with worsening abdominal pain, vomiting, diarrhea despite treatment for influenza x 2 days with Tamiflu. Patient found to have portal vein thrombosis. Pharmacy dosing heparin. Not on anticoagulation PTA.  -heparin level up to 0.2 after heparin bolus and rate increase   Goal of Therapy:  Heparin level 0.3-0.7 units/ml Monitor platelets by anticoagulation protocol: Yes   Plan:  -Re-bolus heparin 2000 units x1 then increase to 2450 units/hr -Heparin level in 6 hours and daily wth CBC daily  Hildred Laser, PharmD Clinical Pharmacist **Pharmacist phone directory can now be found on [email or web address] ([email or web address]).  Listed under [place].

## 2019-01-07 NOTE — Progress Notes (Signed)
Pt has CPAP from home which he is placing himself on during hours of sleep.  No issues noted at time of check.

## 2019-01-07 NOTE — Progress Notes (Signed)
ANTICOAGULATION CONSULT NOTE  Pharmacy Consult for Heparin Indication: portal vein thrombosis  Heparin Dosing Weight: 95.9 kg  Labs: Recent Labs    01/06/19 1127 01/06/19 1323 01/06/19 2157 01/07/19 0404 01/07/19 0843  HGB 13.2 11.9*  --  11.3*  --   HCT 41.6 35.0*  --  34.1*  --   PLT 276  --   --  259  --   HEPARINUNFRC  --   --  0.19*  --  0.17*  CREATININE 1.00  --   --  1.07  --     Assessment: 54 yom presenting with worsening abdominal pain, vomiting, diarrhea despite treatment for influenza x 2 days with Tamiflu. Patient found to have portal vein thrombosis. Pharmacy consulted to dose heparin. Not on anticoagulation PTA. Hg 11.9, plt wnl. No active bleed issues documented.  2/26 AM update: heparin level below goal 0.17  Goal of Therapy:  Heparin level 0.3-0.7 units/ml Monitor platelets by anticoagulation protocol: Yes   Plan:  Re-bolus heparin 3000 units/hr Inc heparin drip to 2150 units/hr Re-check heparin level in 6-8 hours  Marc May, PharmD, [place] Pager: [phone] Please utilize Amion for appropriate phone number to reach the unit pharmacist ([place])

## 2019-01-08 LAB — BASIC METABOLIC PANEL
ANION GAP: 7 (ref 5–15)
BUN: 9 mg/dL (ref 6–20)
CO2: 25 mmol/L (ref 22–32)
Calcium: 7.9 mg/dL — ABNORMAL LOW (ref 8.9–10.3)
Chloride: 105 mmol/L (ref 98–111)
Creatinine, Ser: 0.81 mg/dL (ref 0.61–1.24)
GFR calc Af Amer: >60 mL/min (ref >60)
GFR calc non Af Amer: >60 mL/min (ref >60)
Glucose, Bld: 107 mg/dL — ABNORMAL HIGH (ref 70–99)
POTASSIUM: 4.3 mmol/L (ref 3.5–5.1)
Sodium: 137 mmol/L (ref 135–145)

## 2019-01-08 LAB — HEPARIN LEVEL (UNFRACTIONATED)
Heparin Unfractionated: 0.23 IU/mL — ABNORMAL LOW (ref 0.30–0.70)
Heparin Unfractionated: 0.24 IU/mL — ABNORMAL LOW (ref 0.30–0.70)
Heparin Unfractionated: 0.28 IU/mL — ABNORMAL LOW (ref 0.30–0.70)

## 2019-01-08 LAB — CBC
HCT: 34.5 % — ABNORMAL LOW (ref 39.0–52.0)
Hemoglobin: 11.1 g/dL — ABNORMAL LOW (ref 13.0–17.0)
MCH: 26.1 pg (ref 26.0–34.0)
MCHC: 32.2 g/dL (ref 30.0–36.0)
MCV: 81 fL (ref 80.0–100.0)
Platelets: 289 10*3/uL (ref 150–400)
RBC: 4.26 MIL/uL (ref 4.22–5.81)
RDW: 14.3 % (ref 11.5–15.5)
WBC: 13.1 10*3/uL — ABNORMAL HIGH (ref 4.0–10.5)
nRBC: 0 % (ref 0.0–0.2)

## 2019-01-08 MED ORDER — ACETAMINOPHEN 325 MG PO TABS
650.0000 mg | ORAL_TABLET | Freq: Four times a day (QID) | ORAL | Status: DC | PRN
  Administered 2019-01-09 – 2019-01-10 (×2): 650 mg via ORAL
  Filled 2019-01-08 (×2): qty 2

## 2019-01-08 MED ORDER — ENOXAPARIN SODIUM 120 MG/0.8ML ~~LOC~~ SOLN
110.0000 mg | Freq: Two times a day (BID) | SUBCUTANEOUS | Status: DC
  Administered 2019-01-08: 110 mg via SUBCUTANEOUS
  Filled 2019-01-08 (×2): qty 0.73

## 2019-01-08 MED ORDER — HYDROMORPHONE HCL 1 MG/ML IJ SOLN
0.5000 mg | INTRAMUSCULAR | Status: DC | PRN
  Administered 2019-01-08 – 2019-01-10 (×10): 0.5 mg via INTRAVENOUS
  Filled 2019-01-08 (×11): qty 1

## 2019-01-08 MED ORDER — HEPARIN BOLUS VIA INFUSION
2000.0000 [IU] | Freq: Once | INTRAVENOUS | Status: AC
  Administered 2019-01-08: 2000 [IU] via INTRAVENOUS
  Filled 2019-01-08: qty 2000

## 2019-01-08 NOTE — Progress Notes (Addendum)
ANTICOAGULATION CONSULT NOTE - Follow Up Consult  Pharmacy Consult for Heparin to Lovenox Indication: portal vein thrombosis  Allergies  Allergen Reactions  . Banana Anaphylaxis  . Bee Venom Anaphylaxis  . Other Anaphylaxis    ANTIBIOTIC; pt does not remember which one    Patient Measurements: Height: 5\' 9"  (175.3 cm) Weight: 250 lb (113.4 kg) IBW/kg (Calculated) : 70.7 Heparin Dosing Weight: 96 kg  Vital Signs: Temp: 102 F (38.9 C) (02/27 1746) Temp Source: Oral (02/27 1746) BP: 127/75 (02/27 1746) Pulse Rate: 99 (02/27 1746)  Labs: Recent Labs    01/06/19 1127 01/06/19 1323  01/07/19 0404  01/08/19 0002 01/08/19 0840 01/08/19 1650  HGB 13.2 11.9*  --  11.3*  --   --  11.1*  --   HCT 41.6 35.0*  --  34.1*  --   --  34.5*  --   PLT 276  --   --  259  --   --  289  --   HEPARINUNFRC  --   --    < >  --    < > 0.28* 0.23* 0.24*  CREATININE 1.00  --   --  1.07  --   --  0.81  --    < > = values in this interval not displayed.    Estimated Creatinine Clearance: 147.5 mL/min (by C-G formula based on SCr of 0.81 mg/dL).  Assessment: 38 yom presenting with worsening abdominal pain, vomiting, diarrhea despite treatment for influenza x 2 days with Tamiflu. Patient found to have portal vein thrombosis. Pharmacy consulted to dose heparin. Not on anticoagulation PTA.   Heparin level remains subtherapeutic (0.24) despite multiple rate increases.  PM: Transition to Lovenox    Goal of Therapy:   Monitor platelets by anticoagulation protocol: Yes   Plan:  DC heparin Start Lovenox 1 hour after heparin stops at 110 mg sq Q 12 hours Monitor CBC   Thank you Anette Guarneri, PharmD [phone] 01/08/2019,6:09 PM

## 2019-01-08 NOTE — Progress Notes (Signed)
[facility] PROGRESS NOTE    Progress Note  Marc May  ZOX:[id number] DOB: 1976-09-14 DOA: 01/06/2019 PCP: [name]     Brief Narrative:   Marc May is an 43 y.o. male past medical history of diverticular disease, obstructive sleep apnea, osteoarthritis who presented to [facility] with abdominal pain nausea vomiting and diarrhea for 5 days.,  Was recently seen at [facility] and treated for the flu a came back as he was not improving.  The scan showed acute diverticulitis and portal vein thrombosis.  Assessment/Plan:   Acute Portal vein thrombosis No history of thromboembolic event. Ultrasound of the abdomen show a 2.7 cm lesion, will need further evaluation with an MRI of the liver today. Will need follow-up with hematology as an outpatient.  Acute diverticulitis Awaiting clear liquid diet, will advance to full liquid diet. Antibiotics de-escalated to IV Unasyn has defervesced his leukocytosis is improved. Blood cultures have remained negative.  Hyponatremia: Likely due to hypovolemia improved with IV fluid hydration. Basic metabolic panel is pending this morning.  New microcytic anemia: We will need further evaluation as an outpatient with GI. Get anemia panel in 2 to 3 days.  OSA (obstructive sleep apnea) Cpap.   DVT prophylaxis: heparin Family Communication:wife Disposition Plan/Barrier to D/C: home once abd pain improved Code Status:     Code Status Orders  (From admission, onward)         Start     Ordered   01/06/19 1924  Full code  Continuous     01/06/19 1923        Code Status History    Date Active Date Inactive Code Status Order ID Comments User Context   11/29/2017 0808 12/01/2017 0013 Full Code [id number]  Kinsinger, Arta Bruce, MD Inpatient        IV Access:    Peripheral IV   Procedures and diagnostic studies:   Ct Abdomen Pelvis W Contrast  Result Date:  01/06/2019 CLINICAL DATA:  43 year old male with a history of abdominal pain vomiting and diarrhea EXAM: CT ABDOMEN AND PELVIS WITH CONTRAST TECHNIQUE: Multidetector CT imaging of the abdomen and pelvis was performed using the standard protocol following bolus administration of intravenous contrast. CONTRAST:  124mL ISOVUE-300 IOPAMIDOL (ISOVUE-300) INJECTION 61% COMPARISON:  11/29/2017 FINDINGS: Lower chest: No acute abnormality. Hepatobiliary: Diffusely decreased attenuation of liver parenchyma. The branching hypodensity of the right liver is thrombosis of branches of the right portal vein. Unremarkable gallbladder. Pancreas: Unremarkable Spleen: Unremarkable Adrenals/Urinary Tract: Unremarkable adrenal glands. No evidence of hydronephrosis or nephrolithiasis. Unremarkable course the bilateral ureters. Unremarkable urinary bladder. Stomach/Bowel: Unremarkable stomach. No transition point of small bowel. Contrast traverses small bowel and reaches the colon. Normal appendix. No colonic obstruction. Redemonstration of diverticular change of the sigmoid colon. Short segment of sigmoid colon demonstrates circumferential wall thickening with inflammatory changes of the adjacent fat, thickening of the fascia, and some small local lymph nodes. No evidence of abscess or perforation. Vascular/Lymphatic: No significant atherosclerotic change. No retroperitoneal adenopathy. Small lymph nodes of the sigmoid colon mesentery. Reproductive: Calcifications of the prostate. Other: Edema in the abdominal fat adjacent to transverse colon. Musculoskeletal: Degenerative changes of the thoracolumbar spine. No displaced fracture. IMPRESSION: Partial right portal vein thrombosis. Circumferential wall thickening of the sigmoid colon in a region of diverticular change. This may represent acute on chronic diverticulitis. Colon cancer can not be excluded, and referral for GI evaluation and colonoscopy is recommended. Liver steatosis. These  results were discussed by  telephone at the time of interpretation on 01/06/2019 at 2:12 pm with Dr. Gareth Morgan. Electronically Signed   By: Corrie Mckusick D.O.   On: 01/06/2019 14:13   Dg Chest Port 1 View  Result Date: 01/07/2019 CLINICAL DATA:  Fever EXAM: PORTABLE CHEST 1 VIEW COMPARISON:  None. FINDINGS: There is no appreciable edema or consolidation. The heart size and pulmonary vascularity are normal. No adenopathy. No bone lesions. IMPRESSION: No edema or consolidation. Electronically Signed   By: Lowella Grip III M.D.   On: 01/07/2019 21:33   US Abdomen Limited Ruq  Result Date: 01/07/2019 CLINICAL DATA:  Cirrhosis, abdominal pain, nausea and vomiting EXAM: ULTRASOUND ABDOMEN LIMITED RIGHT UPPER QUADRANT COMPARISON:  CT 12/17/2018 FINDINGS: Gallbladder: No gallstones or wall thickening visualized. No sonographic Murphy sign noted by sonographer. Common bile duct: Diameter: 3.9 mm, unremarkable Liver: Heterogenous echogenic parenchyma. Nonspecific 2.7 cm subcapsular hypoechoic region in the left lobe, without definite correlate on prior CT. Main portal vein is patent on color Doppler imaging with normal direction of blood flow towards the liver. IMPRESSION: 1. Normal gallbladder. 2. Echogenic hepatic parenchyma with 2.7 cm hypoechoic left lobe region may represent focal fatty sparing versus neoplasm. Recommend attention on follow-up imaging. Electronically Signed   By: Lucrezia Europe M.D.   On: 01/07/2019 12:29     Medical Consultants:    None.  Anti-Infectives:   IV flagyl and unasyn  Subjective:    Marc May he has improved as well as abdominal pain he would like his diet advanced to full.  Objective:    Vitals:   01/07/19 1753 01/07/19 2012 01/07/19 2145 01/08/19 0450  BP: 117/73 125/78  122/84  Pulse: 97 (!) 104  80  Resp: 18   20  Temp: 99.6 F (37.6 C) (!) 103 F (39.4 C) 100.1 F (37.8 C) 98.2 F (36.8 C)  TempSrc: Oral Oral Oral Oral  SpO2: 90% 95%   98%  Weight:      Height:        Intake/Output Summary (Last 24 hours) at 01/08/2019 0749 Last data filed at 01/08/2019 0601 Gross per 24 hour  Intake 3517.64 ml  Output -  Net 3517.64 ml   Filed Weights   01/06/19 1113  Weight: 113.4 kg    Exam: General exam: In no acute distress, obese. Respiratory system: Good air movement and clear to auscultation. Cardiovascular system: S1 & S2 heard, RRR.  Gastrointestinal system: Bowel sounds soft nontender nondistended Central nervous system: Alert and oriented. No focal neurological deficits. Extremities: No pedal edema. Skin: No rashes, lesions or ulcers Psychiatry: Judgement and insight appear normal. Mood & affect appropriate.    Data Reviewed:    Labs: Basic Metabolic Panel: Recent Labs  Lab 01/06/19 1127 01/06/19 1323 01/07/19 0404  NA 132* 135 133*  K 3.5 3.7 3.3*  CL 97*  --  96*  CO2 25  --  25  GLUCOSE 142*  --  111*  BUN 10  --  6  CREATININE 1.00  --  1.07  CALCIUM 8.6*  --  7.8*   GFR Estimated Creatinine Clearance: 111.7 mL/min (by C-G formula based on SCr of 1.07 mg/dL). Liver Function Tests: Recent Labs  Lab 01/06/19 1127 01/07/19 0404  AST 23 20  ALT 26 23  ALKPHOS 91 76  BILITOT 1.1 1.3*  PROT 8.3* 7.0  ALBUMIN 3.3* 2.2*   Recent Labs  Lab 01/06/19 1127  LIPASE 22   No results for input(s): AMMONIA in the last  168 hours. Coagulation profile No results for input(s): INR, PROTIME in the last 168 hours.  CBC: Recent Labs  Lab 01/06/19 1127 01/06/19 1323 01/07/19 0404  WBC 18.1*  --  15.8*  NEUTROABS 16.0*  --   --   HGB 13.2 11.9* 11.3*  HCT 41.6 35.0* 34.1*  MCV 81.9  --  79.7*  PLT 276  --  259   Cardiac Enzymes: No results for input(s): CKTOTAL, CKMB, CKMBINDEX, TROPONINI in the last 168 hours. BNP (last 3 results) No results for input(s): PROBNP in the last 8760 hours. CBG: No results for input(s): GLUCAP in the last 168 hours. D-Dimer: No results for input(s): DDIMER  in the last 72 hours. Hgb A1c: Recent Labs    01/07/19 0843  HGBA1C 5.8*   Lipid Profile: No results for input(s): CHOL, HDL, LDLCALC, TRIG, CHOLHDL, LDLDIRECT in the last 72 hours. Thyroid function studies: No results for input(s): TSH, T4TOTAL, T3FREE, THYROIDAB in the last 72 hours.  Invalid input(s): FREET3 Anemia work up: No results for input(s): VITAMINB12, FOLATE, FERRITIN, TIBC, IRON, RETICCTPCT in the last 72 hours. Sepsis Labs: Recent Labs  Lab 01/06/19 1127 01/07/19 0404  WBC 18.1* 15.8*   Microbiology No results found for this or any previous visit (from the past 240 hour(s)).   Medications:    Continuous Infusions: . sodium chloride 250 mL (01/06/19 1547)  . sodium chloride 100 mL/hr at 01/07/19 2323  . ampicillin-sulbactam (UNASYN) IV 3 g (01/08/19 0137)  . heparin 2,600 Units/hr (01/08/19 0137)      LOS: 2 days   Charlynne Cousins  [facility]  01/08/2019, 7:49 AM

## 2019-01-08 NOTE — Progress Notes (Signed)
ANTICOAGULATION CONSULT NOTE  Pharmacy Consult for Heparin Indication: portal vein thrombosis  Heparin Dosing Weight: 95.9 kg  Labs: Recent Labs    01/06/19 1127 01/06/19 1323  01/07/19 0404 01/07/19 0843 01/07/19 1528 01/08/19 0002  HGB 13.2 11.9*  --  11.3*  --   --   --   HCT 41.6 35.0*  --  34.1*  --   --   --   PLT 276  --   --  259  --   --   --   HEPARINUNFRC  --   --    < >  --  0.17* 0.20* 0.28*  CREATININE 1.00  --   --  1.07  --   --   --    < > = values in this interval not displayed.    Assessment: 62 yom presenting with worsening abdominal pain, vomiting, diarrhea despite treatment for influenza x 2 days with Tamiflu. Patient found to have portal vein thrombosis. Pharmacy consulted to dose heparin. Not on anticoagulation PTA. Hg 11.9, plt wnl. No active bleed issues documented.  2/27 AM update: heparin level sub-therapeutic but trending up, no issues per RN.   Goal of Therapy:  Heparin level 0.3-0.7 units/ml Monitor platelets by anticoagulation protocol: Yes   Plan:  Inc heparin drip to 2600 units/hr Re-check heparin level in 6-8 hours  Narda Bonds, PharmD, [place] Pharmacist Phone: [phone]

## 2019-01-08 NOTE — Progress Notes (Signed)
ANTICOAGULATION CONSULT NOTE - Follow Up Consult  Pharmacy Consult for Heparin Indication: portal vein thrombosis  Allergies  Allergen Reactions  . Banana Anaphylaxis  . Bee Venom Anaphylaxis  . Other Anaphylaxis    ANTIBIOTIC; pt does not remember which one    Patient Measurements: Height: 5\' 9"  (175.3 cm) Weight: 250 lb (113.4 kg) IBW/kg (Calculated) : 70.7 Heparin Dosing Weight: 96 kg  Vital Signs: Temp: 98.8 F (37.1 C) (02/27 0958) Temp Source: Oral (02/27 0958) BP: 123/75 (02/27 0958) Pulse Rate: 80 (02/27 0958)  Labs: Recent Labs    01/06/19 1127 01/06/19 1323  01/07/19 0404  01/07/19 1528 01/08/19 0002 01/08/19 0840  HGB 13.2 11.9*  --  11.3*  --   --   --  11.1*  HCT 41.6 35.0*  --  34.1*  --   --   --  34.5*  PLT 276  --   --  259  --   --   --  289  HEPARINUNFRC  --   --    < >  --    < > 0.20* 0.28* 0.23*  CREATININE 1.00  --   --  1.07  --   --   --  0.81   < > = values in this interval not displayed.    Estimated Creatinine Clearance: 147.5 mL/min (by C-G formula based on SCr of 0.81 mg/dL).  Assessment:  31 yom presenting with worsening abdominal pain, vomiting, diarrhea despite treatment for influenza x 2 days with Tamiflu. Patient found to have portal vein thrombosis. Pharmacy consulted to dose heparin. Not on anticoagulation PTA.      Heparin level remains subtherapeutic (0.23) despite multiple rate increases.  Currently running at 2500 units/hr (though ordered at 2600 units/hr).  CBC stable. No bleeding reported.  Goal of Therapy:  Heparin level 0.3-0.7 units/ml Monitor platelets by anticoagulation protocol: Yes   Plan:   Increase heparin drip to 2750 units/hr  Heparin level ~6 hrs after rate change.  Daily heparin level and CBC while on heparin.  Arty Baumgartner, [place] Pager: [phone] or phone: [phone] 01/08/2019,10:53 AM

## 2019-01-08 NOTE — Progress Notes (Signed)
Pt has home CPAP machine.  

## 2019-01-09 ENCOUNTER — Inpatient Hospital Stay (HOSPITAL_COMMUNITY): Payer: BLUE CROSS/BLUE SHIELD

## 2019-01-09 LAB — CBC
HCT: 31.6 % — ABNORMAL LOW (ref 39.0–52.0)
Hemoglobin: 10.5 g/dL — ABNORMAL LOW (ref 13.0–17.0)
MCH: 26.6 pg (ref 26.0–34.0)
MCHC: 33.2 g/dL (ref 30.0–36.0)
MCV: 80 fL (ref 80.0–100.0)
Platelets: 335 10*3/uL (ref 150–400)
RBC: 3.95 MIL/uL — ABNORMAL LOW (ref 4.22–5.81)
RDW: 14.2 % (ref 11.5–15.5)
WBC: 11.2 10*3/uL — AB (ref 4.0–10.5)
nRBC: 0 % (ref 0.0–0.2)

## 2019-01-09 LAB — BASIC METABOLIC PANEL
Anion gap: 9 (ref 5–15)
BUN: 6 mg/dL (ref 6–20)
CO2: 24 mmol/L (ref 22–32)
Calcium: 7.7 mg/dL — ABNORMAL LOW (ref 8.9–10.3)
Chloride: 102 mmol/L (ref 98–111)
Creatinine, Ser: 0.88 mg/dL (ref 0.61–1.24)
GFR calc Af Amer: >60 mL/min (ref >60)
Glucose, Bld: 103 mg/dL — ABNORMAL HIGH (ref 70–99)
Potassium: 3.7 mmol/L (ref 3.5–5.1)
SODIUM: 135 mmol/L (ref 135–145)

## 2019-01-09 MED ORDER — ENOXAPARIN SODIUM 120 MG/0.8ML ~~LOC~~ SOLN
120.0000 mg | Freq: Two times a day (BID) | SUBCUTANEOUS | Status: DC
  Administered 2019-01-09 – 2019-01-11 (×6): 120 mg via SUBCUTANEOUS
  Filled 2019-01-09 (×7): qty 0.8

## 2019-01-09 MED ORDER — GADOBUTROL 1 MMOL/ML IV SOLN
10.0000 mL | Freq: Once | INTRAVENOUS | Status: AC | PRN
  Administered 2019-01-09: 10 mL via INTRAVENOUS

## 2019-01-09 NOTE — Evaluation (Addendum)
Physical Therapy Evaluation and Discharge Patient Details Name: Marc May MRN: [id number] DOB: [birth year] Today's Date: 01/09/2019   History of Present Illness  Pt is a 43 y/o male with a PMH significant for diverticular disease, OSA, OA bilateral knees (planning B TKR end of March). Pt presented to the [facility] with abdominal pain, N/V/D x5 days after being treated for the flu and not improving. CT abdomen showed diverticulitis and portal vein thrombosis.   Clinical Impression  Patient evaluated by Physical Therapy with no further acute PT needs identified. All education has been completed and the patient has no further questions. At the time of Pt eval pt was independently ambulating halls with IV pole. He has baseline LE deficits 2 bilateral knee OA - unchanged mobility at this time. Pt reports he is motivated to walk in the halls as much as he can while admitted, and feel he is safe to do so without PT/staff supervising. See below for any follow-up Physical Therapy or equipment needs. PT is signing off. Thank you for this referral.     Follow Up Recommendations No PT follow up    Equipment Recommendations  Cane(Shower chair)    Recommendations for Other Services       Precautions / Restrictions Precautions Precautions: None(mod fall risk) Restrictions Weight Bearing Restrictions: No      Mobility  Bed Mobility               General bed mobility comments: Pt was received sitting in the waiting room on the phone because he "needed to get out of the room."  Transfers Overall transfer level: Independent Equipment used: None                Ambulation/Gait Ambulation/Gait assistance: Modified independent (Device/Increase time) Gait Distance (Feet): 500 Feet Assistive device: IV Pole Gait Pattern/deviations: Step-through pattern;Decreased stride length;Decreased weight shift to right;Decreased weight shift to left;Antalgic Gait velocity:  Decreased Gait velocity interpretation: 1.31 - 2.62 ft/sec, indicative of limited community ambulator General Gait Details: Baseline antalgia due to B OA of knees. Using IV pole for support but typically uses the wall or furniture for stability.   Stairs            Wheelchair Mobility    Modified Rankin (Stroke Patients Only)       Balance Overall balance assessment: Modified Independent                                           Pertinent Vitals/Pain Pain Assessment: Faces Faces Pain Scale: Hurts a little bit Pain Location: Abdomen Pain Descriptors / Indicators: Discomfort Pain Intervention(s): Limited activity within patient's tolerance;Monitored during session;Repositioned    Home Living Family/patient expects to be discharged to:: Private residence Living Arrangements: Spouse/significant other Available Help at Discharge: Family;Available 24 hours/day(between wife, father, and friends) Type of Home: House Home Access: Level entry     Home Layout: One level Home Equipment: None      Prior Function Level of Independence: Independent         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 Pt reports he was planning a B TKR at the end of March prior to this admission.      Hand Dominance        Extremity/Trunk Assessment   Upper Extremity Assessment Upper Extremity Assessment: Overall WFL for tasks assessed    Lower Extremity Assessment  Lower Extremity Assessment: RLE deficits/detail;LLE deficits/detail RLE Deficits / Details: Bilateral knees with OA and planning for knee replacements at the end of March.     Cervical / Trunk Assessment Cervical / Trunk Assessment: Normal  Communication   Communication: No difficulties  Cognition Arousal/Alertness: Awake/alert Behavior During Therapy: WFL for tasks assessed/performed Overall Cognitive Status: Within Functional Limits for tasks assessed                                         General Comments      Exercises     Assessment/Plan    PT Assessment Patent does not need any further PT services  PT Problem List         PT Treatment Interventions      PT Goals (Current goals can be found in the Care Plan section)  Acute Rehab PT Goals Patient Stated Goal: Be able to get his knee replacements at the end of March PT Goal Formulation: All assessment and education complete, DC therapy    Frequency     Barriers to discharge        Co-evaluation               AM-PAC PT "6 Clicks" Mobility  Outcome Measure Help needed turning from your back to your side while in a flat bed without using bedrails?: None Help needed moving from lying on your back to sitting on the side of a flat bed without using bedrails?: None Help needed moving to and from a bed to a chair (including a wheelchair)?: None Help needed standing up from a chair using your arms (e.g., wheelchair or bedside chair)?: None Help needed to walk in hospital room?: None Help needed climbing 3-5 steps with a railing? : A Little 6 Click Score: 23    End of Session   Activity Tolerance: Patient tolerated treatment well Patient left: in bed;with call bell/phone within reach(Sitting EOB) Nurse Communication: Mobility status PT Visit Diagnosis: Other abnormalities of gait and mobility (R26.89)    Time: 6244-6950 PT Time Calculation (min) (ACUTE ONLY): 9 min   Charges:   PT Evaluation $PT Eval Low Complexity: 1 Low          Rolinda Roan, PT, DPT Acute Rehabilitation Services Pager: [phone] Office: [phone]   Thelma Comp 01/09/2019, 12:56 PM

## 2019-01-09 NOTE — Progress Notes (Signed)
ANTICOAGULATION CONSULT NOTE - Follow Up Consult  Pharmacy Consult for Lovenox Indication: portal vein thrombosis  Allergies  Allergen Reactions  . Banana Anaphylaxis  . Bee Venom Anaphylaxis  . Other Anaphylaxis    ANTIBIOTIC; pt does not remember which one    Patient Measurements: Height: 5\' 9"  (175.3 cm) Weight: 264 lb 8.8 oz (120 kg) IBW/kg (Calculated) : 70.7 Heparin Dosing Weight: 96 kg Lovenox dosing weight: 120 kg  Vital Signs: Temp: 98.7 F (37.1 C) (02/28 0947) Temp Source: Oral (02/28 0947) BP: 118/72 (02/28 0947) Pulse Rate: 81 (02/28 0947)  Labs: Recent Labs    01/07/19 0404  01/08/19 0002 01/08/19 0840 01/08/19 1650 01/09/19 0352  HGB 11.3*  --   --  11.1*  --  10.5*  HCT 34.1*  --   --  34.5*  --  31.6*  PLT 259  --   --  289  --  335  HEPARINUNFRC  --    < > 0.28* 0.23* 0.24*  --   CREATININE 1.07  --   --  0.81  --  0.88   < > = values in this interval not displayed.    Estimated Creatinine Clearance: 139.8 mL/min (by C-G formula based on SCr of 0.88 mg/dL).  Assessment:  21 yom presenting with worsening abdominal pain, vomiting, diarrhea despite treatment for influenza x 2 days with Tamiflu. Patient found to have portal vein thrombosis. Pharmacy consulted to dose heparin. Not on anticoagulation PTA.      Heparin levels were subtherapeutic despite multiple rate increases and changed to Lovenox last night. Lovenox dosed on weight of 113 kg on 2/25; weight 120 kg today.  Goal of Therapy:  Anti-Xa level 0.6-1 units/ml 4hrs after LMWH dose given Monitor platelets by anticoagulation protocol: Yes   Plan:    Lovenox adjusted from 110 to 120 mg SQ q12hrs, which is syringe size.   Follow up long-term anticoagulation plans.  Arty Baumgartner, [place] Pager: [phone] or phone: [phone] 01/09/2019,10:15 AM

## 2019-01-09 NOTE — Progress Notes (Signed)
[facility] PROGRESS NOTE    Progress Note  [name]  DJM:[id number] DOB: 1976-08-18 DOA: 01/06/2019 PCP: [name]     Brief Narrative:   Marc May is an 43 y.o. male past medical history of diverticular disease, obstructive sleep apnea, osteoarthritis who presented to [facility] with abdominal pain nausea vomiting and diarrhea for 5 days.,  Was recently seen at [facility] and treated for the flu a came back as he was not improving.  The scan showed acute diverticulitis and portal vein thrombosis.  Assessment/Plan:   Acute Portal vein thrombosis No history of thromboembolic event. Ultrasound of the abdomen show a 2.7 cm lesion, will need further evaluation with an MRI of the liver today. Will need follow-up with hematology as an outpatient.  Acute diverticulitis Patient would like to try a soft diet. Unasyn he had a mild temperature yesterday but his leukocytosis is improving, he relates he is slightly better. He still continues to have abdominal pain. Blood cultures have remained negative.  Hyponatremia: Likely due to hypovolemia improved with IV fluid hydration. Basic metabolic panel is pending this morning.  New microcytic anemia: We will need further evaluation as an outpatient with GI. Get anemia panel in 2 to 3 days.  OSA (obstructive sleep apnea) Cpap.   DVT prophylaxis: heparin Family Communication:wife Disposition Plan/Barrier to D/C: home once abd pain improved Code Status:     Code Status Orders  (From admission, onward)         Start     Ordered   01/06/19 1924  Full code  Continuous     01/06/19 1923        Code Status History    Date Active Date Inactive Code Status Order ID Comments User Context   11/29/2017 0808 12/01/2017 0013 Full Code [id number]  Kinsinger, Arta Bruce, MD Inpatient        IV Access:    Peripheral IV   Procedures and diagnostic studies:   Mr  Liver W Wo Contrast  Result Date: 01/09/2019 CLINICAL DATA:  Hypoechoic liver lesion on ultrasound. EXAM: MRI ABDOMEN WITHOUT AND WITH CONTRAST TECHNIQUE: Multiplanar multisequence MR imaging of the abdomen was performed both before and after the administration of intravenous contrast. CONTRAST:  10 cc Gadavist COMPARISON:  Ultrasound exam 01/07/2019.  CT scan 01/06/2019. FINDINGS: Lower chest: Dependent atelectasis noted in both lower lobes. Hepatobiliary: Mild geographic fatty deposition noted in the liver parenchyma. Periportal edema noted in the right hepatic lobe. Imaging after IV contrast administration shows differential perfusion between the right and left liver. As on prior CT, thrombus is identified in the right portal venous system, progressive in the posterior right liver with new portal vein thrombus evident. No evidence for thrombus within the left portal venous anatomy. No intrahepatic biliary duct dilatation. No extrahepatic biliary duct dilatation. Gallbladder is nondistended. Pancreas: No focal mass lesion. No dilatation of the main duct. No intraparenchymal cyst. No peripancreatic edema. Spleen:  No splenomegaly. No focal mass lesion. Adrenals/Urinary Tract: No adrenal nodule or mass. Kidneys unremarkable. Stomach/Bowel: Stomach is unremarkable. No gastric wall thickening. No evidence of outlet obstruction. Duodenum is normally positioned as is the ligament of Treitz. No small bowel or colonic dilatation within the visualized abdomen. Vascular/Lymphatic: No abdominal aortic aneurysm. The main portal vein and superior mesenteric vein are patent. 1.8 cm short axis hepato duodenal ligament lymph node was 1.4 cm on a CT scan from 11/29/2017. Other upper normal hepato duodenal ligament lymph nodes are  mildly progressed as well. No visualized retroperitoneal lymphadenopathy. Other:  No substantial intraperitoneal free fluid. Musculoskeletal: No abnormal marrow signal within the visualized bony  anatomy. IMPRESSION: 1. No evidence for focal left hepatic lesion as suggested on recent ultrasound. 2. Right portal vein thrombosis again noted. Since the prior CT, there has been extension of thrombus into the posterior division of the right portal vein. Resultant differential hepatic parenchymal perfusion evident. Electronically Signed   By: Misty Stanley M.D.   On: 01/09/2019 08:04   Dg Chest Port 1 View  Result Date: 01/07/2019 CLINICAL DATA:  Fever EXAM: PORTABLE CHEST 1 VIEW COMPARISON:  None. FINDINGS: There is no appreciable edema or consolidation. The heart size and pulmonary vascularity are normal. No adenopathy. No bone lesions. IMPRESSION: No edema or consolidation. Electronically Signed   By: Lowella Grip III M.D.   On: 01/07/2019 21:33     Medical Consultants:    None.  Anti-Infectives:   IV flagyl and unasyn  Subjective:    Marc May abdominal pain has improved he is willing to try a soft diet today. Objective:    Vitals:   01/08/19 1746 01/08/19 2157 01/09/19 0410 01/09/19 0947  BP: 127/75 137/84 129/82 118/72  Pulse: 99 95 84 81  Resp: 20 (!) 21 19 18   Temp: (!) 102 F (38.9 C) 99.1 F (37.3 C) 98.7 F (37.1 C) 98.7 F (37.1 C)  TempSrc: Oral Oral Oral Oral  SpO2: 96% 96% 95% 98%  Weight:   120 kg   Height:        Intake/Output Summary (Last 24 hours) at 01/09/2019 1006 Last data filed at 01/09/2019 0600 Gross per 24 hour  Intake 3526.77 ml  Output 350 ml  Net 3176.77 ml   Filed Weights   01/06/19 1113 01/09/19 0410  Weight: 113.4 kg 120 kg    Exam: General exam: In no acute distress, obese. Respiratory system: Good air movement and clear to auscultation. Cardiovascular system: S1 & S2 heard, RRR.  Gastrointestinal system: Bowel sounds soft nontender nondistended Central nervous system: Alert and oriented. No focal neurological deficits. Extremities: No pedal edema. Skin: No rashes, lesions or ulcers Psychiatry: Judgement and  insight appear normal. Mood & affect appropriate.    Data Reviewed:    Labs: Basic Metabolic Panel: Recent Labs  Lab 01/06/19 1127 01/06/19 1323 01/07/19 0404 01/08/19 0840 01/09/19 0352  NA 132* 135 133* 137 135  K 3.5 3.7 3.3* 4.3 3.7  CL 97*  --  96* 105 102  CO2 25  --  25 25 24   GLUCOSE 142*  --  111* 107* 103*  BUN 10  --  6 9 6   CREATININE 1.00  --  1.07 0.81 0.88  CALCIUM 8.6*  --  7.8* 7.9* 7.7*   GFR Estimated Creatinine Clearance: 139.8 mL/min (by C-G formula based on SCr of 0.88 mg/dL). Liver Function Tests: Recent Labs  Lab 01/06/19 1127 01/07/19 0404  AST 23 20  ALT 26 23  ALKPHOS 91 76  BILITOT 1.1 1.3*  PROT 8.3* 7.0  ALBUMIN 3.3* 2.2*   Recent Labs  Lab 01/06/19 1127  LIPASE 22   No results for input(s): AMMONIA in the last 168 hours. Coagulation profile No results for input(s): INR, PROTIME in the last 168 hours.  CBC: Recent Labs  Lab 01/06/19 1127 01/06/19 1323 01/07/19 0404 01/08/19 0840 01/09/19 0352  WBC 18.1*  --  15.8* 13.1* 11.2*  NEUTROABS 16.0*  --   --   --   --  HGB 13.2 11.9* 11.3* 11.1* 10.5*  HCT 41.6 35.0* 34.1* 34.5* 31.6*  MCV 81.9  --  79.7* 81.0 80.0  PLT 276  --  259 289 335   Cardiac Enzymes: No results for input(s): CKTOTAL, CKMB, CKMBINDEX, TROPONINI in the last 168 hours. BNP (last 3 results) No results for input(s): PROBNP in the last 8760 hours. CBG: No results for input(s): GLUCAP in the last 168 hours. D-Dimer: No results for input(s): DDIMER in the last 72 hours. Hgb A1c: Recent Labs    01/07/19 0843  HGBA1C 5.8*   Lipid Profile: No results for input(s): CHOL, HDL, LDLCALC, TRIG, CHOLHDL, LDLDIRECT in the last 72 hours. Thyroid function studies: No results for input(s): TSH, T4TOTAL, T3FREE, THYROIDAB in the last 72 hours.  Invalid input(s): FREET3 Anemia work up: No results for input(s): VITAMINB12, FOLATE, FERRITIN, TIBC, IRON, RETICCTPCT in the last 72 hours. Sepsis Labs: Recent  Labs  Lab 01/06/19 1127 01/07/19 0404 01/08/19 0840 01/09/19 0352  WBC 18.1* 15.8* 13.1* 11.2*   Microbiology Recent Results (from the past 240 hour(s))  Culture, blood (Routine X 2) w Reflex to ID Panel     Status: None (Preliminary result)   Collection Time: 01/08/19 12:03 AM  Result Value Ref Range Status   Specimen Description BLOOD LEFT ARM  Final   Special Requests   Final    BOTTLES DRAWN AEROBIC AND ANAEROBIC Blood Culture adequate volume   Culture NO GROWTH 1 DAY  Final   Report Status PENDING  Incomplete  Culture, blood (Routine X 2) w Reflex to ID Panel     Status: None (Preliminary result)   Collection Time: 01/08/19 12:03 AM  Result Value Ref Range Status   Specimen Description BLOOD RIGHT ARM  Final   Special Requests   Final    BOTTLES DRAWN AEROBIC ONLY Blood Culture adequate volume   Culture NO GROWTH 1 DAY  Final   Report Status PENDING  Incomplete     Medications:   . enoxaparin (LOVENOX) injection  120 mg Subcutaneous Q12H   Continuous Infusions: . sodium chloride 250 mL (01/06/19 1547)  . sodium chloride 100 mL/hr at 01/09/19 0243  . ampicillin-sulbactam (UNASYN) IV 3 g (01/09/19 0922)      LOS: 3 days   Charlynne Cousins  [facility]  01/09/2019, 10:06 AM

## 2019-01-10 LAB — BASIC METABOLIC PANEL WITH GFR
Anion gap: 8 (ref 5–15)
BUN: 6 mg/dL (ref 6–20)
CO2: 25 mmol/L (ref 22–32)
Calcium: 8.1 mg/dL — ABNORMAL LOW (ref 8.9–10.3)
Chloride: 104 mmol/L (ref 98–111)
Creatinine, Ser: 0.78 mg/dL (ref 0.61–1.24)
GFR calc Af Amer: >60 mL/min (ref >60)
GFR calc non Af Amer: >60 mL/min (ref >60)
Glucose, Bld: 105 mg/dL — ABNORMAL HIGH (ref 70–99)
Potassium: 3.8 mmol/L (ref 3.5–5.1)
Sodium: 137 mmol/L (ref 135–145)

## 2019-01-10 LAB — HEPATIC FUNCTION PANEL
ALT: 34 U/L (ref 0–44)
AST: 28 U/L (ref 15–41)
Albumin: 2.1 g/dL — ABNORMAL LOW (ref 3.5–5.0)
Alkaline Phosphatase: 116 U/L (ref 38–126)
Bilirubin, Direct: 0.2 mg/dL (ref 0.0–0.2)
Indirect Bilirubin: 0.3 mg/dL (ref 0.3–0.9)
Total Bilirubin: 0.5 mg/dL (ref 0.3–1.2)
Total Protein: 6.7 g/dL (ref 6.5–8.1)

## 2019-01-10 MED ORDER — METOCLOPRAMIDE HCL 5 MG/ML IJ SOLN: 5.0000 mg | Freq: Two times a day (BID) | INTRAMUSCULAR | Status: DC | PRN

## 2019-01-10 MED ORDER — HYDROMORPHONE HCL 1 MG/ML IJ SOLN
0.5000 mg | Freq: Four times a day (QID) | INTRAMUSCULAR | Status: DC | PRN
  Administered 2019-01-10 – 2019-01-11 (×3): 0.5 mg via INTRAVENOUS
  Filled 2019-01-10 (×3): qty 1

## 2019-01-10 MED ORDER — PANTOPRAZOLE SODIUM 40 MG PO TBEC
40.0000 mg | DELAYED_RELEASE_TABLET | Freq: Two times a day (BID) | ORAL | Status: DC
  Administered 2019-01-10 – 2019-01-12 (×5): 40 mg via ORAL
  Filled 2019-01-10 (×5): qty 1

## 2019-01-10 NOTE — Progress Notes (Signed)
[facility] PROGRESS NOTE    Progress Note  Marc May  UXN:[id number] DOB: [birth year] DOA: 01/06/2019 PCP: [name]     Brief Narrative:   Marc May is an 43 y.o. male past medical history of diverticular disease, obstructive sleep apnea, osteoarthritis who presented to [facility] with abdominal pain nausea vomiting and diarrhea for 5 days.,  Was recently seen at [facility] and treated for the flu a came back as he was not improving.  The scan showed acute diverticulitis and portal vein thrombosis.  Assessment/Plan:   Acute Portal vein thrombosis No history of thromboembolic event. MRI of the liver not show a mass in the liver. It did show a portal vein thrombosis. Will need follow-up with hematology as an outpatient. To new IV Lovenox he relates he still feels anorexic. No Signs of bleeding. Complete metabolic panel tomorrow morning  Acute diverticulitis Did not eat his diet yesterday or breakfast this morning. To new IV Unasyn he has remained afebrile leukocytosis continues to improve. He still continues to have abdominal pain. Blood cultures have remained negative.  Hyponatremia: Likely due to hypovolemia improved with IV fluid hydration. Basic metabolic panel is pending this morning.  New microcytic anemia: We will need further evaluation as an outpatient with GI. Get anemia panel in 2 to 3 days.  OSA (obstructive sleep apnea) Cpap.   DVT prophylaxis: Lovenox Family Communication:wife Disposition Plan/Barrier to D/C: home once abd pain improved and tolerating his diet. Code Status:     Code Status Orders  (From admission, onward)         Start     Ordered   01/06/19 1924  Full code  Continuous     01/06/19 1923        Code Status History    Date Active Date Inactive Code Status Order ID Comments User Context   11/29/2017 0808 12/01/2017 0013 Full Code [id number]  Kinsinger, Arta Bruce, MD  Inpatient        IV Access:    Peripheral IV   Procedures and diagnostic studies:   Mr Liver W Wo Contrast  Result Date: 01/09/2019 CLINICAL DATA:  Hypoechoic liver lesion on ultrasound. EXAM: MRI ABDOMEN WITHOUT AND WITH CONTRAST TECHNIQUE: Multiplanar multisequence MR imaging of the abdomen was performed both before and after the administration of intravenous contrast. CONTRAST:  10 cc Gadavist COMPARISON:  Ultrasound exam 01/07/2019.  CT scan 01/06/2019. FINDINGS: Lower chest: Dependent atelectasis noted in both lower lobes. Hepatobiliary: Mild geographic fatty deposition noted in the liver parenchyma. Periportal edema noted in the right hepatic lobe. Imaging after IV contrast administration shows differential perfusion between the right and left liver. As on prior CT, thrombus is identified in the right portal venous system, progressive in the posterior right liver with new portal vein thrombus evident. No evidence for thrombus within the left portal venous anatomy. No intrahepatic biliary duct dilatation. No extrahepatic biliary duct dilatation. Gallbladder is nondistended. Pancreas: No focal mass lesion. No dilatation of the main duct. No intraparenchymal cyst. No peripancreatic edema. Spleen:  No splenomegaly. No focal mass lesion. Adrenals/Urinary Tract: No adrenal nodule or mass. Kidneys unremarkable. Stomach/Bowel: Stomach is unremarkable. No gastric wall thickening. No evidence of outlet obstruction. Duodenum is normally positioned as is the ligament of Treitz. No small bowel or colonic dilatation within the visualized abdomen. Vascular/Lymphatic: No abdominal aortic aneurysm. The main portal vein and superior mesenteric vein are patent. 1.8 cm short axis hepato duodenal ligament lymph node was  1.4 cm on a CT scan from 11/29/2017. Other upper normal hepato duodenal ligament lymph nodes are mildly progressed as well. No visualized retroperitoneal lymphadenopathy. Other:  No substantial  intraperitoneal free fluid. Musculoskeletal: No abnormal marrow signal within the visualized bony anatomy. IMPRESSION: 1. No evidence for focal left hepatic lesion as suggested on recent ultrasound. 2. Right portal vein thrombosis again noted. Since the prior CT, there has been extension of thrombus into the posterior division of the right portal vein. Resultant differential hepatic parenchymal perfusion evident. Electronically Signed   By: Misty Stanley M.D.   On: 01/09/2019 08:04     Medical Consultants:    None.  Anti-Infectives:   IV flagyl and unasyn  Subjective:    Marc May abdominal pain has improved he is willing to try a soft diet today. Objective:    Vitals:   01/09/19 0410 01/09/19 0947 01/09/19 1959 01/10/19 0448  BP: 129/82 118/72 133/84 121/79  Pulse: 84 81 90 76  Resp: 19 18 19 18   Temp: 98.7 F (37.1 C) 98.7 F (37.1 C) 98.5 F (36.9 C) 98.1 F (36.7 C)  TempSrc: Oral Oral Oral Oral  SpO2: 95% 98% 97% 100%  Weight: 123 kg  124.5 kg   Height:        Intake/Output Summary (Last 24 hours) at 01/10/2019 0928 Last data filed at 01/10/2019 0600 Gross per 24 hour  Intake 1583.95 ml  Output 0 ml  Net 1583.95 ml   Filed Weights   01/06/19 1113 01/09/19 0410 01/09/19 1959  Weight: 113.4 kg 123 kg 124.5 kg    Exam: General exam: In no acute distress, obese. Respiratory system: Good air movement and clear to auscultation. Cardiovascular system: S1 & S2 heard, RRR.  Gastrointestinal system: Bowel sounds tender in the left lower quadrant nondistended no rebound or guarding Central nervous system: Alert and oriented. No focal neurological deficits. Extremities: No pedal edema. Skin: No rashes, lesions or ulcers Psychiatry: Judgement and insight appear normal. Mood & affect appropriate.    Data Reviewed:    Labs: Basic Metabolic Panel: Recent Labs  Lab 01/06/19 1127 01/06/19 1323 01/07/19 0404 01/08/19 0840 01/09/19 0352 01/10/19 0411  NA  132* 135 133* 137 135 137  K 3.5 3.7 3.3* 4.3 3.7 3.8  CL 97*  --  96* 105 102 104  CO2 25  --  25 25 24 25   GLUCOSE 142*  --  111* 107* 103* 105*  BUN 10  --  6 9 6 6   CREATININE 1.00  --  1.07 0.81 0.88 0.78  CALCIUM 8.6*  --  7.8* 7.9* 7.7* 8.1*   GFR Estimated Creatinine Clearance: 156.9 mL/min (by C-G formula based on SCr of 0.78 mg/dL). Liver Function Tests: Recent Labs  Lab 01/06/19 1127 01/07/19 0404  AST 23 20  ALT 26 23  ALKPHOS 91 76  BILITOT 1.1 1.3*  PROT 8.3* 7.0  ALBUMIN 3.3* 2.2*   Recent Labs  Lab 01/06/19 1127  LIPASE 22   No results for input(s): AMMONIA in the last 168 hours. Coagulation profile No results for input(s): INR, PROTIME in the last 168 hours.  CBC: Recent Labs  Lab 01/06/19 1127 01/06/19 1323 01/07/19 0404 01/08/19 0840 01/09/19 0352  WBC 18.1*  --  15.8* 13.1* 11.2*  NEUTROABS 16.0*  --   --   --   --   HGB 13.2 11.9* 11.3* 11.1* 10.5*  HCT 41.6 35.0* 34.1* 34.5* 31.6*  MCV 81.9  --  79.7* 81.0 80.0  PLT 276  --  259 289 335   Cardiac Enzymes: No results for input(s): CKTOTAL, CKMB, CKMBINDEX, TROPONINI in the last 168 hours. BNP (last 3 results) No results for input(s): PROBNP in the last 8760 hours. CBG: No results for input(s): GLUCAP in the last 168 hours. D-Dimer: No results for input(s): DDIMER in the last 72 hours. Hgb A1c: No results for input(s): HGBA1C in the last 72 hours. Lipid Profile: No results for input(s): CHOL, HDL, LDLCALC, TRIG, CHOLHDL, LDLDIRECT in the last 72 hours. Thyroid function studies: No results for input(s): TSH, T4TOTAL, T3FREE, THYROIDAB in the last 72 hours.  Invalid input(s): FREET3 Anemia work up: No results for input(s): VITAMINB12, FOLATE, FERRITIN, TIBC, IRON, RETICCTPCT in the last 72 hours. Sepsis Labs: Recent Labs  Lab 01/06/19 1127 01/07/19 0404 01/08/19 0840 01/09/19 0352  WBC 18.1* 15.8* 13.1* 11.2*   Microbiology Recent Results (from the past 240 hour(s))    Culture, blood (Routine X 2) w Reflex to ID Panel     Status: None (Preliminary result)   Collection Time: 01/08/19 12:03 AM  Result Value Ref Range Status   Specimen Description BLOOD LEFT ARM  Final   Special Requests   Final    BOTTLES DRAWN AEROBIC AND ANAEROBIC Blood Culture adequate volume   Culture NO GROWTH 1 DAY  Final   Report Status PENDING  Incomplete  Culture, blood (Routine X 2) w Reflex to ID Panel     Status: None (Preliminary result)   Collection Time: 01/08/19 12:03 AM  Result Value Ref Range Status   Specimen Description BLOOD RIGHT ARM  Final   Special Requests   Final    BOTTLES DRAWN AEROBIC ONLY Blood Culture adequate volume   Culture NO GROWTH 1 DAY  Final   Report Status PENDING  Incomplete     Medications:   . enoxaparin (LOVENOX) injection  120 mg Subcutaneous Q12H   Continuous Infusions: . sodium chloride 250 mL (01/06/19 1547)  . sodium chloride 100 mL/hr at 01/09/19 2020  . ampicillin-sulbactam (UNASYN) IV 3 g (01/10/19 0752)      LOS: 4 days   Charlynne Cousins  [facility]  01/10/2019, 9:28 AM

## 2019-01-11 ENCOUNTER — Inpatient Hospital Stay (HOSPITAL_COMMUNITY): Payer: BLUE CROSS/BLUE SHIELD

## 2019-01-11 DIAGNOSIS — M79609 Pain in unspecified limb: Secondary | ICD-10-CM

## 2019-01-11 LAB — COMPREHENSIVE METABOLIC PANEL
ALT: 37 U/L (ref 0–44)
AST: 26 U/L (ref 15–41)
Albumin: 2.2 g/dL — ABNORMAL LOW (ref 3.5–5.0)
Alkaline Phosphatase: 114 U/L (ref 38–126)
Anion gap: 10 (ref 5–15)
BUN: 7 mg/dL (ref 6–20)
CO2: 24 mmol/L (ref 22–32)
CREATININE: 1.02 mg/dL (ref 0.61–1.24)
Calcium: 8.1 mg/dL — ABNORMAL LOW (ref 8.9–10.3)
Chloride: 103 mmol/L (ref 98–111)
GFR calc Af Amer: >60 mL/min (ref >60)
GFR calc non Af Amer: >60 mL/min (ref >60)
Glucose, Bld: 101 mg/dL — ABNORMAL HIGH (ref 70–99)
POTASSIUM: 4 mmol/L (ref 3.5–5.1)
Sodium: 137 mmol/L (ref 135–145)
Total Bilirubin: 0.6 mg/dL (ref 0.3–1.2)
Total Protein: 7.1 g/dL (ref 6.5–8.1)

## 2019-01-11 LAB — IRON AND TIBC
Iron: 57 ug/dL (ref 45–182)
Saturation Ratios: 25 % (ref 17.9–39.5)
TIBC: 232 ug/dL — ABNORMAL LOW (ref 250–450)
UIBC: 175 ug/dL

## 2019-01-11 LAB — VITAMIN B12: Vitamin B-12: 624 pg/mL (ref 180–914)

## 2019-01-11 LAB — CBC
HCT: 37.2 % — ABNORMAL LOW (ref 39.0–52.0)
Hemoglobin: 12.1 g/dL — ABNORMAL LOW (ref 13.0–17.0)
MCH: 26.2 pg (ref 26.0–34.0)
MCHC: 32.5 g/dL (ref 30.0–36.0)
MCV: 80.5 fL (ref 80.0–100.0)
Platelets: 505 10*3/uL — ABNORMAL HIGH (ref 150–400)
RBC: 4.62 MIL/uL (ref 4.22–5.81)
RDW: 14.5 % (ref 11.5–15.5)
WBC: 12.3 10*3/uL — ABNORMAL HIGH (ref 4.0–10.5)
nRBC: 0 % (ref 0.0–0.2)

## 2019-01-11 LAB — FERRITIN: Ferritin: 401 ng/mL — ABNORMAL HIGH (ref 24–336)

## 2019-01-11 LAB — RETICULOCYTES
Immature Retic Fract: 17.4 % — ABNORMAL HIGH (ref 2.3–15.9)
RBC.: 4.62 MIL/uL (ref 4.22–5.81)
Retic Count, Absolute: 57.3 10*3/uL (ref 19.0–186.0)
Retic Ct Pct: 1.2 % (ref 0.4–3.1)

## 2019-01-11 LAB — FOLATE: FOLATE: 13.7 ng/mL (ref >5.9)

## 2019-01-11 MED ORDER — IOHEXOL 300 MG/ML  SOLN
100.0000 mL | Freq: Once | INTRAMUSCULAR | Status: AC | PRN
  Administered 2019-01-11: 100 mL via INTRAVENOUS

## 2019-01-11 NOTE — Progress Notes (Signed)
*  PRELIMINARY RESULTS* Vascular Ultrasound Bilateral lower extremity venous duplex has been completed.  Please see CV Proc tab for preliminary results.  Everrett Coombe 01/11/2019, 2:33 PM

## 2019-01-11 NOTE — Progress Notes (Signed)
[facility] PROGRESS NOTE    Progress Note  Marc May  GMW:[id number] DOB: 25-Feb-1976 DOA: 01/06/2019 PCP: [name]     Brief Narrative:   Marc May is an 43 y.o. male past medical history of diverticular disease, obstructive sleep apnea, osteoarthritis who presented to [facility] with abdominal pain nausea vomiting and diarrhea for 5 days.,  Was recently seen at [facility] and treated for the flu a came back as he was not improving.  The scan showed acute diverticulitis and portal vein thrombosis.  Assessment/Plan:   Acute Portal vein thrombosis No history of thromboembolic event. MRI of the liver not show a mass in the liver. It did show a portal vein thrombosis. Will need follow-up with hematology as an outpatient. Cont IV Lovenox. No Signs of bleeding. Complete metabolic panel tomorrow morning  Acute diverticulitis Tolerating his diet. Continue IV Unasyn he has remained afebrile leukocytosis continues to improve. Other abdominal pain. Blood cultures have remained negative. Repeat CT scan of the abdomen and pelvis, repeat CBC this morning.  Hyponatremia: Likely due to hypovolemia improved with IV fluid hydration. Basic metabolic panel is pending this morning.  New microcytic anemia: We will need further evaluation as an outpatient with GI. Check an anemia panel.  OSA (obstructive sleep apnea) Cpap.   DVT prophylaxis: Lovenox Family Communication:wife Disposition Plan/Barrier to D/C: home once abd pain improved and tolerating his diet. Code Status:     Code Status Orders  (From admission, onward)         Start     Ordered   01/06/19 1924  Full code  Continuous     01/06/19 1923        Code Status History    Date Active Date Inactive Code Status Order ID Comments User Context   11/29/2017 0808 12/01/2017 0013 Full Code [id number]  Kinsinger, Arta Bruce, MD Inpatient        IV Access:      Peripheral IV   Procedures and diagnostic studies:   No results found.   Medical Consultants:    None.  Anti-Infectives:   IV flagyl and unasyn  Subjective:    Marc May not tolerating his diet complaining of abdominal pain. Objective:    Vitals:   01/10/19 0448 01/10/19 0944 01/10/19 2031 01/11/19 0551  BP: 121/79 123/74 122/77 115/77  Pulse: 76 77 88 73  Resp: 18 18 20 19   Temp: 98.1 F (36.7 C) 98 F (36.7 C) 98.6 F (37 C) 97.6 F (36.4 C)  TempSrc: Oral Oral Oral Oral  SpO2: 100% 99% 95% 96%  Weight:   124.6 kg   Height:        Intake/Output Summary (Last 24 hours) at 01/11/2019 0818 Last data filed at 01/11/2019 0749 Gross per 24 hour  Intake 1150.18 ml  Output 0 ml  Net 1150.18 ml   Filed Weights   01/09/19 0410 01/09/19 1959 01/10/19 2031  Weight: 123 kg 124.5 kg 124.6 kg    Exam: General exam: In no acute distress, obese. Respiratory system: Good air movement and clear to auscultation. Cardiovascular system: S1 & S2 heard, RRR.  Gastrointestinal system: Bowel sounds tender in the left lower quadrant nondistended no rebound or guarding Central nervous system: Alert and oriented. No focal neurological deficits. Extremities: No pedal edema. Skin: No rashes, lesions or ulcers Psychiatry: Judgement and insight appear normal. Mood & affect appropriate.    Data Reviewed:    Labs: Basic Metabolic Panel: Recent  Labs  Lab 01/07/19 0404 01/08/19 0840 01/09/19 0352 01/10/19 0411 01/11/19 0543  NA 133* 137 135 137 137  K 3.3* 4.3 3.7 3.8 4.0  CL 96* 105 102 104 103  CO2 25 25 24 25 24   GLUCOSE 111* 107* 103* 105* 101*  BUN 6 9 6 6 7   CREATININE 1.07 0.81 0.88 0.78 1.02  CALCIUM 7.8* 7.9* 7.7* 8.1* 8.1*   GFR Estimated Creatinine Clearance: 123.2 mL/min (by C-G formula based on SCr of 1.02 mg/dL). Liver Function Tests: Recent Labs  Lab 01/06/19 1127 01/07/19 0404 01/10/19 0411 01/11/19 0543  AST 23 20 28 26   ALT 26 23 34  37  ALKPHOS 91 76 116 114  BILITOT 1.1 1.3* 0.5 0.6  PROT 8.3* 7.0 6.7 7.1  ALBUMIN 3.3* 2.2* 2.1* 2.2*   Recent Labs  Lab 01/06/19 1127  LIPASE 22   No results for input(s): AMMONIA in the last 168 hours. Coagulation profile No results for input(s): INR, PROTIME in the last 168 hours.  CBC: Recent Labs  Lab 01/06/19 1127 01/06/19 1323 01/07/19 0404 01/08/19 0840 01/09/19 0352  WBC 18.1*  --  15.8* 13.1* 11.2*  NEUTROABS 16.0*  --   --   --   --   HGB 13.2 11.9* 11.3* 11.1* 10.5*  HCT 41.6 35.0* 34.1* 34.5* 31.6*  MCV 81.9  --  79.7* 81.0 80.0  PLT 276  --  259 289 335   Cardiac Enzymes: No results for input(s): CKTOTAL, CKMB, CKMBINDEX, TROPONINI in the last 168 hours. BNP (last 3 results) No results for input(s): PROBNP in the last 8760 hours. CBG: No results for input(s): GLUCAP in the last 168 hours. D-Dimer: No results for input(s): DDIMER in the last 72 hours. Hgb A1c: No results for input(s): HGBA1C in the last 72 hours. Lipid Profile: No results for input(s): CHOL, HDL, LDLCALC, TRIG, CHOLHDL, LDLDIRECT in the last 72 hours. Thyroid function studies: No results for input(s): TSH, T4TOTAL, T3FREE, THYROIDAB in the last 72 hours.  Invalid input(s): FREET3 Anemia work up: No results for input(s): VITAMINB12, FOLATE, FERRITIN, TIBC, IRON, RETICCTPCT in the last 72 hours. Sepsis Labs: Recent Labs  Lab 01/06/19 1127 01/07/19 0404 01/08/19 0840 01/09/19 0352  WBC 18.1* 15.8* 13.1* 11.2*   Microbiology Recent Results (from the past 240 hour(s))  Culture, blood (Routine X 2) w Reflex to ID Panel     Status: None (Preliminary result)   Collection Time: 01/08/19 12:03 AM  Result Value Ref Range Status   Specimen Description BLOOD LEFT ARM  Final   Special Requests   Final    BOTTLES DRAWN AEROBIC AND ANAEROBIC Blood Culture adequate volume   Culture   Final    NO GROWTH 2 DAYS Performed at [facility], [place] [address] [place], [place] [address]     Report Status PENDING  Incomplete  Culture, blood (Routine X 2) w Reflex to ID Panel     Status: None (Preliminary result)   Collection Time: 01/08/19 12:03 AM  Result Value Ref Range Status   Specimen Description BLOOD RIGHT ARM  Final   Special Requests   Final    BOTTLES DRAWN AEROBIC ONLY Blood Culture adequate volume   Culture   Final    NO GROWTH 2 DAYS Performed at [facility], [place] [address] [place], [place] [address]    Report Status PENDING  Incomplete     Medications:   . enoxaparin (LOVENOX) injection  120 mg Subcutaneous Q12H  .  pantoprazole  40 mg Oral BID   Continuous Infusions: . sodium chloride 250 mL (01/06/19 1547)  . sodium chloride 10 mL/hr at 01/10/19 1332  . ampicillin-sulbactam (UNASYN) IV 3 g (01/11/19 0809)      LOS: 5 days   Charlynne Cousins  [facility]  01/11/2019, 8:18 AM

## 2019-01-11 NOTE — Progress Notes (Signed)
  Patient has home CPAP at bedside. Does not need any assistance from RT at this time. Patient aware to call Respiratory if he needs anything.

## 2019-01-12 LAB — CBC
HCT: 35.7 % — ABNORMAL LOW (ref 39.0–52.0)
HEMOGLOBIN: 11.6 g/dL — AB (ref 13.0–17.0)
MCH: 26.5 pg (ref 26.0–34.0)
MCHC: 32.5 g/dL (ref 30.0–36.0)
MCV: 81.5 fL (ref 80.0–100.0)
Platelets: 493 10*3/uL — ABNORMAL HIGH (ref 150–400)
RBC: 4.38 MIL/uL (ref 4.22–5.81)
RDW: 14.6 % (ref 11.5–15.5)
WBC: 11.7 10*3/uL — ABNORMAL HIGH (ref 4.0–10.5)
nRBC: 0 % (ref 0.0–0.2)

## 2019-01-12 MED ORDER — RIVAROXABAN 15 MG PO TABS: 15.0000 mg | ORAL_TABLET | Freq: Two times a day (BID) | ORAL | 0 refills | Status: DC

## 2019-01-12 MED ORDER — RIVAROXABAN 20 MG PO TABS: 20.0000 mg | ORAL_TABLET | Freq: Every day | ORAL | 6 refills | Status: DC

## 2019-01-12 MED ORDER — AMOXICILLIN-POT CLAVULANATE 875-125 MG PO TABS: 1.0000 | ORAL_TABLET | Freq: Two times a day (BID) | ORAL | 0 refills | Status: AC

## 2019-01-12 MED ORDER — RIVAROXABAN 20 MG PO TABS: 20.0000 mg | ORAL_TABLET | Freq: Every day | ORAL | Status: DC

## 2019-01-12 MED ORDER — AMOXICILLIN-POT CLAVULANATE 875-125 MG PO TABS: 1.0000 | ORAL_TABLET | Freq: Two times a day (BID) | ORAL | Status: DC

## 2019-01-12 MED ORDER — RIVAROXABAN 15 MG PO TABS
15.0000 mg | ORAL_TABLET | Freq: Two times a day (BID) | ORAL | Status: DC
  Administered 2019-01-12: 15 mg via ORAL
  Filled 2019-01-12: qty 1

## 2019-01-12 NOTE — Discharge Summary (Addendum)
Physician Discharge Summary  Marc May PNT:[id number] DOB: 02-06-1976 DOA: 01/06/2019  PCP: [facility], [place] date: 01/06/2019 Discharge date: 01/12/2019  Admitted From: Home Disposition:  Home  Recommendations for Outpatient Follow-up:  1. Follow up with PCP in 1-2 weeks 2. Follow-up with oncology as an outpatient in 4 to 6 weeks to evaluate hypercoagulable state.  Home Health:No Equipment/Devices:None  Discharge Condition:stable CODE STATUS:Full Diet recommendation: Heart Healthy   Brief/Interim Summary: You may copy/paste interim summary or write brief hospital course depending on length of stay  Discharge Diagnoses:  Principal Problem:   Portal vein thrombosis Active Problems:   Acute diverticulitis   Hyponatremia   OSA (obstructive sleep apnea)   Microcytic anemia Acute portal vein thrombosis: He has no family history of thrombotic events. ET scan of the abdomen pelvis showed portal vein thrombosis he was started on IV Lovenox and his abdominal pain improved. He experienced no signs of bleeding. Ultrasound was done on admission that showed no signs of cirrhosis but it did show a circumscribed abnormality an MRI of the liver was done that was unrevealing. He was changed to oral Xarelto which she will continue as an outpatient. He will follow-up with oncology as an outpatient to evaluate hypercoagulable state this was not tested on admission he was started on IV heparin.  Diverticulitis: He was started on IV Zosyn his abdominal pain improved leukocytosis resolved and he defervesced he was tolerating his diet. Due to his multiple complaints of his abdomen repeated CT scan was done that showed improvement of his acute diverticulitis. He will continue oral Augmentin as an outpatient for 2 additional days.  Hyponatremia: Likely due to hypovolemia resolved with IV fluid hydration.  Normocytic anemia: We will need evaluation as an outpatient  by GI and anemia panel was checked and it was unreliable acting it as it is an acute phase reactant.   Discharge Instructions  Discharge Instructions    Diet - low sodium heart healthy   Complete by:  As directed    Increase activity slowly   Complete by:  As directed      Allergies as of 01/12/2019      Reactions   Banana Anaphylaxis   Bee Venom Anaphylaxis   Other Anaphylaxis   ANTIBIOTIC; pt does not remember which one      Medication List    STOP taking these medications   ondansetron 4 MG disintegrating tablet Commonly known as:  ZOFRAN-ODT   oseltamivir 75 MG capsule Commonly known as:  TAMIFLU     TAKE these medications   amoxicillin-clavulanate 875-125 MG tablet Commonly known as:  AUGMENTIN Take 1 tablet by mouth every 12 (twelve) hours for 3 days.   benzonatate 100 MG capsule Commonly known as:  TESSALON Take 100-200 mg by mouth every 8 (eight) hours as needed for cough.   Rivaroxaban 15 MG Tabs tablet Commonly known as:  XARELTO Take 1 tablet (15 mg total) by mouth 2 (two) times daily with a meal.   rivaroxaban 20 MG Tabs tablet Commonly known as:  XARELTO Take 1 tablet (20 mg total) by mouth daily with supper. Start taking on:  February 02, 2019   traMADol 50 MG tablet Commonly known as:  ULTRAM Take 1-2 tablets (50-100 mg total) by mouth every 6 (six) hours as needed. What changed:  reasons to take this      Follow-up Information    Isaac Bliss, Rayford Halsted, MD Follow up.   Specialty:  Internal Medicine  Contact information: [place] [phone]        [place] Follow up in 2 week(s).   Why:  follow up for Hypercoagulable evalaution         Allergies  Allergen Reactions  . Banana Anaphylaxis  . Bee Venom Anaphylaxis  . Other Anaphylaxis    ANTIBIOTIC; pt does not remember which one    Consultations: None  Procedures/Studies: Ct Abdomen Pelvis W Contrast  Result Date:  01/11/2019 CLINICAL DATA:  43 year old male with acute abdominal pain and distension. EXAM: CT ABDOMEN AND PELVIS WITH CONTRAST TECHNIQUE: Multidetector CT imaging of the abdomen and pelvis was performed using the standard protocol following bolus administration of intravenous contrast. CONTRAST:  184mL OMNIPAQUE IOHEXOL 300 MG/ML  SOLN COMPARISON:  01/06/2019 CT and 01/09/2019 MR FINDINGS: Lower chest: No acute abnormality. Mild RIGHT basilar atelectasis/scarring again noted. Hepatobiliary: RIGHT portal vein thrombosis again noted. No new hepatic abnormalities are present. The gallbladder is unremarkable. No biliary dilatation. Pancreas: Unremarkable Spleen: Unremarkable Adrenals/Urinary Tract: The kidneys, adrenal glands and bladder are unremarkable. Stomach/Bowel: Stomach is within normal limits. Appendix appears normal. No evidence of bowel wall thickening, distention, or inflammatory changes. Colonic diverticulosis noted without evidence of diverticulitis. Vascular/Lymphatic: No significant vascular findings are present. No enlarged abdominal or pelvic lymph nodes. Reproductive: Prostate is unremarkable. Other: No ascites, abdominal wall hernia, abscess or pneumoperitoneum. Focal area of fat necrosis within the anterior RIGHT abdomen is again noted. Musculoskeletal: No acute or suspicious bony abnormalities identified. IMPRESSION: 1. No evidence of acute abnormality 2. Unchanged RIGHT portal vein thrombosis 3. Colonic diverticulosis without evidence of diverticulitis. Electronically Signed   By: Margarette Canada M.D.   On: 01/11/2019 16:01   Ct Abdomen Pelvis W Contrast  Result Date: 01/06/2019 CLINICAL DATA:  43 year old male with a history of abdominal pain vomiting and diarrhea EXAM: CT ABDOMEN AND PELVIS WITH CONTRAST TECHNIQUE: Multidetector CT imaging of the abdomen and pelvis was performed using the standard protocol following bolus administration of intravenous contrast. CONTRAST:  132mL ISOVUE-300  IOPAMIDOL (ISOVUE-300) INJECTION 61% COMPARISON:  11/29/2017 FINDINGS: Lower chest: No acute abnormality. Hepatobiliary: Diffusely decreased attenuation of liver parenchyma. The branching hypodensity of the right liver is thrombosis of branches of the right portal vein. Unremarkable gallbladder. Pancreas: Unremarkable Spleen: Unremarkable Adrenals/Urinary Tract: Unremarkable adrenal glands. No evidence of hydronephrosis or nephrolithiasis. Unremarkable course the bilateral ureters. Unremarkable urinary bladder. Stomach/Bowel: Unremarkable stomach. No transition point of small bowel. Contrast traverses small bowel and reaches the colon. Normal appendix. No colonic obstruction. Redemonstration of diverticular change of the sigmoid colon. Short segment of sigmoid colon demonstrates circumferential wall thickening with inflammatory changes of the adjacent fat, thickening of the fascia, and some small local lymph nodes. No evidence of abscess or perforation. Vascular/Lymphatic: No significant atherosclerotic change. No retroperitoneal adenopathy. Small lymph nodes of the sigmoid colon mesentery. Reproductive: Calcifications of the prostate. Other: Edema in the abdominal fat adjacent to transverse colon. Musculoskeletal: Degenerative changes of the thoracolumbar spine. No displaced fracture. IMPRESSION: Partial right portal vein thrombosis. Circumferential wall thickening of the sigmoid colon in a region of diverticular change. This may represent acute on chronic diverticulitis. Colon cancer can not be excluded, and referral for GI evaluation and colonoscopy is recommended. Liver steatosis. These results were discussed by telephone at the time of interpretation on 01/06/2019 at 2:12 pm with Dr. Gareth Morgan. Electronically Signed   By: Corrie Mckusick D.O.   On: 01/06/2019 14:13   Mr Liver W Wo Contrast  Result Date: 01/09/2019 CLINICAL DATA:  Hypoechoic liver lesion on ultrasound. EXAM: MRI ABDOMEN WITHOUT AND WITH  CONTRAST TECHNIQUE: Multiplanar multisequence MR imaging of the abdomen was performed both before and after the administration of intravenous contrast. CONTRAST:  10 cc Gadavist COMPARISON:  Ultrasound exam 01/07/2019.  CT scan 01/06/2019. FINDINGS: Lower chest: Dependent atelectasis noted in both lower lobes. Hepatobiliary: Mild geographic fatty deposition noted in the liver parenchyma. Periportal edema noted in the right hepatic lobe. Imaging after IV contrast administration shows differential perfusion between the right and left liver. As on prior CT, thrombus is identified in the right portal venous system, progressive in the posterior right liver with new portal vein thrombus evident. No evidence for thrombus within the left portal venous anatomy. No intrahepatic biliary duct dilatation. No extrahepatic biliary duct dilatation. Gallbladder is nondistended. Pancreas: No focal mass lesion. No dilatation of the main duct. No intraparenchymal cyst. No peripancreatic edema. Spleen:  No splenomegaly. No focal mass lesion. Adrenals/Urinary Tract: No adrenal nodule or mass. Kidneys unremarkable. Stomach/Bowel: Stomach is unremarkable. No gastric wall thickening. No evidence of outlet obstruction. Duodenum is normally positioned as is the ligament of Treitz. No small bowel or colonic dilatation within the visualized abdomen. Vascular/Lymphatic: No abdominal aortic aneurysm. The main portal vein and superior mesenteric vein are patent. 1.8 cm short axis hepato duodenal ligament lymph node was 1.4 cm on a CT scan from 11/29/2017. Other upper normal hepato duodenal ligament lymph nodes are mildly progressed as well. No visualized retroperitoneal lymphadenopathy. Other:  No substantial intraperitoneal free fluid. Musculoskeletal: No abnormal marrow signal within the visualized bony anatomy. IMPRESSION: 1. No evidence for focal left hepatic lesion as suggested on recent ultrasound. 2. Right portal vein thrombosis again  noted. Since the prior CT, there has been extension of thrombus into the posterior division of the right portal vein. Resultant differential hepatic parenchymal perfusion evident. Electronically Signed   By: Misty Stanley M.D.   On: 01/09/2019 08:04   Dg Chest Port 1 View  Result Date: 01/07/2019 CLINICAL DATA:  Fever EXAM: PORTABLE CHEST 1 VIEW COMPARISON:  None. FINDINGS: There is no appreciable edema or consolidation. The heart size and pulmonary vascularity are normal. No adenopathy. No bone lesions. IMPRESSION: No edema or consolidation. Electronically Signed   By: Lowella Grip III M.D.   On: 01/07/2019 21:33   Vas [place] Lower Extremity Venous (dvt)  Result Date: 01/11/2019  Lower Venous Study Indications: Portal Vein Thrombosis, and Swelling.  Performing Technologist: Antonieta Pert RDMS, RVT  Examination Guidelines: A complete evaluation includes B-mode imaging, spectral Doppler, color Doppler, and power Doppler as needed of all accessible portions of each vessel. Bilateral testing is considered an integral part of a complete examination. Limited examinations for reoccurring indications may be performed as noted.  Right Venous Findings: +---------+---------------+---------+-----------+----------+-------+          CompressibilityPhasicitySpontaneityPropertiesSummary +---------+---------------+---------+-----------+----------+-------+ CFV      Full           Yes      Yes                          +---------+---------------+---------+-----------+----------+-------+ SFJ      Full                                                 +---------+---------------+---------+-----------+----------+-------+ FV Prox  Full                                                 +---------+---------------+---------+-----------+----------+-------+ FV Mid   Full                                                 +---------+---------------+---------+-----------+----------+-------+ FV DistalFull                                                  +---------+---------------+---------+-----------+----------+-------+ PFV      Full                                                 +---------+---------------+---------+-----------+----------+-------+ POP      Full           Yes      Yes                          +---------+---------------+---------+-----------+----------+-------+ PTV      Full                                                 +---------+---------------+---------+-----------+----------+-------+ PERO     Full                                                 +---------+---------------+---------+-----------+----------+-------+ GSV      Full                                                 +---------+---------------+---------+-----------+----------+-------+  Left Venous Findings: +---------+---------------+---------+-----------+----------+-------+          CompressibilityPhasicitySpontaneityPropertiesSummary +---------+---------------+---------+-----------+----------+-------+ CFV      Full           Yes      Yes                          +---------+---------------+---------+-----------+----------+-------+ SFJ      Full                                                 +---------+---------------+---------+-----------+----------+-------+ FV Prox  Full                                                 +---------+---------------+---------+-----------+----------+-------+  FV Mid   Full                                                 +---------+---------------+---------+-----------+----------+-------+ FV DistalFull                                                 +---------+---------------+---------+-----------+----------+-------+ PFV      Full                                                 +---------+---------------+---------+-----------+----------+-------+ POP      Full           Yes      Yes                           +---------+---------------+---------+-----------+----------+-------+ PTV      Full                                                 +---------+---------------+---------+-----------+----------+-------+ PERO     Full                                                 +---------+---------------+---------+-----------+----------+-------+ GSV      Full                                                 +---------+---------------+---------+-----------+----------+-------+    Summary: Right: There is no evidence of deep vein thrombosis in the lower extremity. No cystic structure found in the popliteal fossa. Rouleaux flow seen throughout extremity. Left: There is no evidence of deep vein thrombosis in the lower extremity. No cystic structure found in the popliteal fossa. Rouleaux flow seen throughout extremity.  *See table(s) above for measurements and observations. Electronically signed by Servando Snare MD on 01/11/2019 at 8:59:20 PM.    Final    US Abdomen Limited Ruq  Result Date: 01/07/2019 CLINICAL DATA:  Cirrhosis, abdominal pain, nausea and vomiting EXAM: ULTRASOUND ABDOMEN LIMITED RIGHT UPPER QUADRANT COMPARISON:  CT 12/17/2018 FINDINGS: Gallbladder: No gallstones or wall thickening visualized. No sonographic Murphy sign noted by sonographer. Common bile duct: Diameter: 3.9 mm, unremarkable Liver: Heterogenous echogenic parenchyma. Nonspecific 2.7 cm subcapsular hypoechoic region in the left lobe, without definite correlate on prior CT. Main portal vein is patent on color Doppler imaging with normal direction of blood flow towards the liver. IMPRESSION: 1. Normal gallbladder. 2. Echogenic hepatic parenchyma with 2.7 cm hypoechoic left lobe region may represent focal fatty sparing versus neoplasm. Recommend attention on follow-up imaging. Electronically Signed   By: Lucrezia Europe M.D.   On: 01/07/2019 12:29    (Echo, Carotid, EGD, Colonoscopy, ERCP)  Subjective: No complaints feels  great.  Discharge Exam: Vitals:   01/12/19 0647 01/12/19 0854  BP: 106/71 106/67  Pulse: 71 75  Resp: 19 18  Temp: 98.8 F (37.1 C) 98 F (36.7 C)  SpO2:       General: Pt is alert, awake, not in acute distress Cardiovascular: RRR, S1/S2 +, no rubs, no gallops Respiratory: CTA bilaterally, no wheezing, no rhonchi Abdominal: Soft, NT, ND, bowel sounds + Extremities: no edema, no cyanosis    The results of significant diagnostics from this hospitalization (including imaging, microbiology, ancillary and laboratory) are listed below for reference.     Microbiology: Recent Results (from the past 240 hour(s))  Culture, blood (Routine X 2) w Reflex to ID Panel     Status: None (Preliminary result)   Collection Time: 01/08/19 12:03 AM  Result Value Ref Range Status   Specimen Description BLOOD LEFT ARM  Final   Special Requests   Final    BOTTLES DRAWN AEROBIC AND ANAEROBIC Blood Culture adequate volume   Culture NO GROWTH 4 DAYS  Final   Report Status PENDING  Incomplete  Culture, blood (Routine X 2) w Reflex to ID Panel     Status: None (Preliminary result)   Collection Time: 01/08/19 12:03 AM  Result Value Ref Range Status   Specimen Description BLOOD RIGHT ARM  Final   Special Requests   Final    BOTTLES DRAWN AEROBIC ONLY Blood Culture adequate volume   Culture NO GROWTH 4 DAYS  Final   Report Status PENDING  Incomplete     Labs: BNP (last 3 results) No results for input(s): BNP in the last 8760 hours. Basic Metabolic Panel: Recent Labs  Lab 01/07/19 0404 01/08/19 0840 01/09/19 0352 01/10/19 0411 01/11/19 0543  NA 133* 137 135 137 137  K 3.3* 4.3 3.7 3.8 4.0  CL 96* 105 102 104 103  CO2 25 25 24 25 24   GLUCOSE 111* 107* 103* 105* 101*  BUN 6 9 6 6 7   CREATININE 1.07 0.81 0.88 0.78 1.02  CALCIUM 7.8* 7.9* 7.7* 8.1* 8.1*   Liver Function Tests: Recent Labs  Lab 01/06/19 1127 01/07/19 0404 01/10/19 0411 01/11/19 0543  AST 23 20 28 26   ALT 26 23 34  37  ALKPHOS 91 76 116 114  BILITOT 1.1 1.3* 0.5 0.6  PROT 8.3* 7.0 6.7 7.1  ALBUMIN 3.3* 2.2* 2.1* 2.2*   Recent Labs  Lab 01/06/19 1127  LIPASE 22   No results for input(s): AMMONIA in the last 168 hours. CBC: Recent Labs  Lab 01/06/19 1127  01/07/19 0404 01/08/19 0840 01/09/19 0352 01/11/19 0900 01/12/19 0435  WBC 18.1*  --  15.8* 13.1* 11.2* 12.3* 11.7*  NEUTROABS 16.0*  --   --   --   --   --   --   HGB 13.2   < > 11.3* 11.1* 10.5* 12.1* 11.6*  HCT 41.6   < > 34.1* 34.5* 31.6* 37.2* 35.7*  MCV 81.9  --  79.7* 81.0 80.0 80.5 81.5  PLT 276  --  259 289 335 505* 493*   < > = values in this interval not displayed.   Cardiac Enzymes: No results for input(s): CKTOTAL, CKMB, CKMBINDEX, TROPONINI in the last 168 hours. BNP: Invalid input(s): POCBNP CBG: No results for input(s): GLUCAP in the last 168 hours. D-Dimer No results for input(s): DDIMER in the last 72 hours. Hgb A1c No results for input(s): HGBA1C in the last 72 hours. Lipid Profile  No results for input(s): CHOL, HDL, LDLCALC, TRIG, CHOLHDL, LDLDIRECT in the last 72 hours. Thyroid function studies No results for input(s): TSH, T4TOTAL, T3FREE, THYROIDAB in the last 72 hours.  Invalid input(s): FREET3 Anemia work up Recent Labs    01/11/19 0900  VITAMINB12 624  FOLATE 13.7  FERRITIN 401*  TIBC 232*  IRON 57  RETICCTPCT 1.2   Urinalysis    Component Value Date/Time   COLORURINE YELLOW 11/28/2017 [place] 11/28/2017 1710   LABSPEC 1.013 11/28/2017 1710   PHURINE 5.0 11/28/2017 1710   GLUCOSEU NEGATIVE 11/28/2017 1710   HGBUR NEGATIVE 11/28/2017 1710   BILIRUBINUR NEGATIVE 11/28/2017 1710   KETONESUR NEGATIVE 11/28/2017 1710   PROTEINUR NEGATIVE 11/28/2017 1710   NITRITE NEGATIVE 11/28/2017 1710   LEUKOCYTESUR NEGATIVE 11/28/2017 1710   Sepsis Labs Invalid input(s): PROCALCITONIN,  WBC,  LACTICIDVEN Microbiology Recent Results (from the past 240 hour(s))  Culture, blood  (Routine X 2) w Reflex to ID Panel     Status: None (Preliminary result)   Collection Time: 01/08/19 12:03 AM  Result Value Ref Range Status   Specimen Description BLOOD LEFT ARM  Final   Special Requests   Final    BOTTLES DRAWN AEROBIC AND ANAEROBIC Blood Culture adequate volume   Culture NO GROWTH 4 DAYS  Final   Report Status PENDING  Incomplete  Culture, blood (Routine X 2) w Reflex to ID Panel     Status: None (Preliminary result)   Collection Time: 01/08/19 12:03 AM  Result Value Ref Range Status   Specimen Description BLOOD RIGHT ARM  Final   Special Requests   Final    BOTTLES DRAWN AEROBIC ONLY Blood Culture adequate volume   Culture NO GROWTH 4 DAYS  Final   Report Status PENDING  Incomplete     Time coordinating discharge: 40 minutes  SIGNED:   Charlynne Cousins, MD  [facility]

## 2019-01-12 NOTE — Progress Notes (Addendum)
ANTICOAGULATION CONSULT NOTE - Follow Up Consult  Pharmacy Consult for Lovenox Indication: portal vein thrombosis  Allergies  Allergen Reactions  . Banana Anaphylaxis  . Bee Venom Anaphylaxis  . Other Anaphylaxis    ANTIBIOTIC; pt does not remember which one   Patient Measurements: Height: 5\' 9"  (175.3 cm) Weight: 274 lb 4 oz (124.4 kg) IBW/kg (Calculated) : 70.7 Heparin Dosing Weight: 96 kg Lovenox dosing weight: 120 kg  Vital Signs: Temp: 98.8 F (37.1 C) (03/02 0647) Temp Source: Oral (03/01 2108) BP: 106/71 (03/02 0647) Pulse Rate: 71 (03/02 0647)  Labs: Recent Labs    01/10/19 0411 01/11/19 0543 01/11/19 0900 01/12/19 0435  HGB  --   --  12.1* 11.6*  HCT  --   --  37.2* 35.7*  PLT  --   --  505* 493*  CREATININE 0.78 1.02  --   --     Estimated Creatinine Clearance: 123 mL/min (by C-G formula based on SCr of 1.02 mg/dL).  Assessment:  56 yom presenting with worsening abdominal pain, vomiting, diarrhea despite treatment for influenza x 2 days with Tamiflu. Patient found to have portal vein thrombosis. Initiated on heparin for portal vein thrombosis. No AC PTA. Then changed to full-dose Lovenox. Hgb 11.6, plts 493.  Goal of Therapy:  Anti-Xa level 0.6-1 units/ml 4hrs after LMWH dose given Monitor platelets by anticoagulation protocol: Yes   Plan:  Lovenox 120 mg SQ q12h Monitor s/sx bleeding Switch to PO anticoag soon?  ADDENDUM:  Last dose of Lovenox given last night Stop Lovenox Start Xarelto 15mg  PO BID x 21 days, then start Xarelto 20mg  PO daily after that Monitor CBC, s/s of bleed   Elenor Quinones, PharmD, BCPS, [facility] Clinical Pharmacist Phone number [phone] 01/12/2019 8:50 AM

## 2019-01-12 NOTE — Discharge Instructions (Signed)
Marc May was admitted to the Hospital on 01/06/2019 and Discharged on Discharge Date 01/12/2019 and should be excused from work/school   for 9   days starting 01/06/2019 , may return to work/school without any restrictions.  Call Bess Harvest MD, [facility] [phone] with questions.  Charlynne Cousins M.D on 01/12/2019,at 10:08 AM  [facility] Office  [phone]   Information on my medicine - XARELTO (rivaroxaban)  [place]? Xarelto was prescribed to treat blood clots that may have been found in the veins of your legs (deep vein thrombosis) or in your lungs (pulmonary embolism) and to reduce the risk of them occurring again.  What do you need to know about Xarelto? The starting dose is one 15 mg tablet taken TWICE daily with food for the FIRST 21 DAYS then on 3/23 the dose is changed to one 20 mg tablet taken ONCE A DAY with your evening meal.  DO NOT stop taking Xarelto without talking to the health care provider who prescribed the medication.  Refill your prescription for 20 mg tablets before you run out.  After discharge, you should have regular check-up appointments with your healthcare provider that is prescribing your Xarelto.  In the future your dose may need to be changed if your kidney function changes by a significant amount.  What do you do if you miss a dose? If you are taking Xarelto TWICE DAILY and you miss a dose, take it as soon as you remember. You may take two 15 mg tablets (total 30 mg) at the same time then resume your regularly scheduled 15 mg twice daily the next day.  If you are taking Xarelto ONCE DAILY and you miss a dose, take it as soon as you remember on the same day then continue your regularly scheduled once daily regimen the next day. Do not take two doses of Xarelto at the same time.   Important Safety Information Xarelto is a blood thinner medicine that can cause bleeding. You should call  your healthcare provider right away if you experience any of the following: ? Bleeding from an injury or your nose that does not stop. ? Unusual colored urine (red or dark brown) or unusual colored stools (red or black). ? Unusual bruising for unknown reasons. ? A serious fall or if you hit your head (even if there is no bleeding).  Some medicines may interact with Xarelto and might increase your risk of bleeding while on Xarelto. To help avoid this, consult your healthcare provider or pharmacist prior to using any new prescription or non-prescription medications, including herbals, vitamins, non-steroidal anti-inflammatory drugs (NSAIDs) and supplements.  This website has more information on Xarelto: [email or web address]    You were cared for by Dr. Charlynne Cousins (a hospitalist) during your hospital stay. If you have any questions about your discharge medications or the care you received while you were in the hospital after you are discharged, you can call the unit and ask to speak with the hospitalist on call if the hospitalist that took care of you is not available. Once you are discharged, your primary care physician will handle any further medical issues. Please note that NO REFILLS for any discharge medications will be authorized once you are discharged, as it is imperative that you return to your primary care physician (or establish a relationship with a primary care physician if you do not have one) for your aftercare needs so that they can reassess your need  for medications and monitor your lab values.  Any outstanding tests can be reviewed by your PCP at your follow up visit.  It is also important to review any medicine changes with your PCP.  Please bring these d/c instructions with you to your next visit so your physician can review these changes with you.  If you do not have a primary care physician, you can call [phone] for a physician referral.  It is highly recommended that you  obtain a PCP for hospital follow up.

## 2019-01-12 NOTE — Progress Notes (Signed)
Patient Discharge: Disposition: Patient discharged to home. Education:  Reviewed medication, follow-up appointments, prescriptions, and discharge instructions, verbalized understanding. IV: Discontinued IV before discharge. Telemetry: N/A Transportation: Patient escorted out of the unit in w/c. Belongings: Patient took all his belongings with him.

## 2019-01-13 LAB — CULTURE, BLOOD (ROUTINE X 2)
Culture: NO GROWTH
Culture: NO GROWTH
Special Requests: ADEQUATE
Special Requests: ADEQUATE

## 2019-01-14 ENCOUNTER — Telehealth: Payer: Self-pay | Admitting: *Deleted

## 2019-01-14 NOTE — Telephone Encounter (Signed)
Left message on machine for patient to return our call.  Patient should schedule his hospital follow up with his PCP at [facility], and then keep/schedule his appointment to establish care with Dr Jerilee Hoh.  CRM

## 2019-01-14 NOTE — Telephone Encounter (Signed)
Copied from [place] [phone]. Topic: Appointment Scheduling - Prior Auth Required for Appointment >> Jan 14, 2019  2:31 PM Vernona Rieger wrote: Patient was just released from [facility] yesterday and was advised to follow up with Dr Jerilee Hoh. She was highly recommended to him. He has not yet established so he needs a new pt/hospital follow up. I offered him next available for next patient, Friday, March 13th. He said he rather not wait that long because he needs to be released back to work and he was being treated for a blood clot. He said that he can come anytime, please advise, thank you!

## 2019-01-19 ENCOUNTER — Emergency Department (HOSPITAL_BASED_OUTPATIENT_CLINIC_OR_DEPARTMENT_OTHER)
Admission: EM | Admit: 2019-01-19 | Discharge: 2019-01-19 | Disposition: A | Discharge status: Home / Self Care | Payer: BLUE CROSS/BLUE SHIELD | Attending: Emergency Medicine | Admitting: Emergency Medicine

## 2019-01-19 ENCOUNTER — Encounter (HOSPITAL_BASED_OUTPATIENT_CLINIC_OR_DEPARTMENT_OTHER): Payer: Self-pay | Admitting: *Deleted

## 2019-01-19 ENCOUNTER — Other Ambulatory Visit: Payer: Self-pay

## 2019-01-19 ENCOUNTER — Emergency Department (HOSPITAL_BASED_OUTPATIENT_CLINIC_OR_DEPARTMENT_OTHER): Payer: BLUE CROSS/BLUE SHIELD

## 2019-01-19 DIAGNOSIS — R079 Chest pain, unspecified: Secondary | ICD-10-CM | POA: Diagnosis not present

## 2019-01-19 DIAGNOSIS — R072 Precordial pain: Secondary | ICD-10-CM | POA: Diagnosis not present

## 2019-01-19 DIAGNOSIS — R0602 Shortness of breath: Secondary | ICD-10-CM

## 2019-01-19 DIAGNOSIS — R0789 Other chest pain: Secondary | ICD-10-CM | POA: Diagnosis not present

## 2019-01-19 DIAGNOSIS — Z7901 Long term (current) use of anticoagulants: Secondary | ICD-10-CM | POA: Insufficient documentation

## 2019-01-19 DIAGNOSIS — R05 Cough: Secondary | ICD-10-CM | POA: Diagnosis not present

## 2019-01-19 LAB — COMPREHENSIVE METABOLIC PANEL
ALK PHOS: 92 U/L (ref 38–126)
ALT: 23 U/L (ref 0–44)
AST: 18 U/L (ref 15–41)
Albumin: 3.2 g/dL — ABNORMAL LOW (ref 3.5–5.0)
Anion gap: 6 (ref 5–15)
BUN: 14 mg/dL (ref 6–20)
CALCIUM: 8.4 mg/dL — AB (ref 8.9–10.3)
CO2: 24 mmol/L (ref 22–32)
Chloride: 106 mmol/L (ref 98–111)
Creatinine, Ser: 0.85 mg/dL (ref 0.61–1.24)
GFR calc Af Amer: >60 mL/min (ref >60)
GFR calc non Af Amer: >60 mL/min (ref >60)
Glucose, Bld: 132 mg/dL — ABNORMAL HIGH (ref 70–99)
Potassium: 3.9 mmol/L (ref 3.5–5.1)
Sodium: 136 mmol/L (ref 135–145)
TOTAL PROTEIN: 7.4 g/dL (ref 6.5–8.1)
Total Bilirubin: 0.4 mg/dL (ref 0.3–1.2)

## 2019-01-19 LAB — CBC WITH DIFFERENTIAL/PLATELET
Abs Immature Granulocytes: 0.04 10*3/uL (ref 0.00–0.07)
Basophils Absolute: 0.1 10*3/uL (ref 0.0–0.1)
Basophils Relative: 1 %
Eosinophils Absolute: 0.1 10*3/uL (ref 0.0–0.5)
Eosinophils Relative: 2 %
HCT: 39.3 % (ref 39.0–52.0)
Hemoglobin: 12.1 g/dL — ABNORMAL LOW (ref 13.0–17.0)
Immature Granulocytes: 1 %
Lymphocytes Relative: 22 %
Lymphs Abs: 1.2 10*3/uL (ref 0.7–4.0)
MCH: 26.5 pg (ref 26.0–34.0)
MCHC: 30.8 g/dL (ref 30.0–36.0)
MCV: 86 fL (ref 80.0–100.0)
Monocytes Absolute: 0.4 10*3/uL (ref 0.1–1.0)
Monocytes Relative: 8 %
NEUTROS ABS: 3.7 10*3/uL (ref 1.7–7.7)
Neutrophils Relative %: 66 %
Platelets: 513 10*3/uL — ABNORMAL HIGH (ref 150–400)
RBC: 4.57 MIL/uL (ref 4.22–5.81)
RDW: 14.8 % (ref 11.5–15.5)
WBC: 5.5 10*3/uL (ref 4.0–10.5)
nRBC: 0 % (ref 0.0–0.2)

## 2019-01-19 LAB — TROPONIN I

## 2019-01-19 MED ORDER — IOPAMIDOL (ISOVUE-370) INJECTION 76%
100.0000 mL | Freq: Once | INTRAVENOUS | Status: AC | PRN
  Administered 2019-01-19: 100 mL via INTRAVENOUS

## 2019-01-19 MED ORDER — SODIUM CHLORIDE 0.9 % IV BOLUS
500.0000 mL | Freq: Once | INTRAVENOUS | Status: AC
  Administered 2019-01-19: 500 mL via INTRAVENOUS

## 2019-01-19 NOTE — ED Provider Notes (Signed)
Emergency Department Provider Note   I have reviewed the triage vital signs and the nursing notes.   HISTORY  Chief Complaint Shortness of Breath; Cough; and Chest Pain   HPI Marc May is a 43 y.o. male returns to the emergency department with left-sided chest pain and subjective shortness of breath.  Symptoms have been ongoing for the past 2 days.  He was recently admitted for diverticulitis and was subsequently found to have a portal vein thrombosis.  He is currently on Xarelto and has been strictly compliant with this medication.  He states that his diverticulitis/abdominal pain symptoms have improved after antibiotics and he is feeling much better until the chest pain began.  He was not having chest pain or shortness of breath symptoms in the hospital.  He denies any fevers, productive cough, chills, body aches.  No international travel.  No radiation of symptoms or modifying factors.   Past Medical History:  Diagnosis Date  . Diverticulitis   . Hernia of abdominal cavity   . Osteoarthritis    knees  . Sleep apnea     Patient Active Problem List   Diagnosis Date Noted  . Microcytic anemia 01/07/2019  . Portal vein thrombosis 01/06/2019  . Acute diverticulitis 01/06/2019  . Hyponatremia 01/06/2019  . OSA (obstructive sleep apnea) 01/06/2019  . Unilateral primary osteoarthritis, left knee 10/29/2018  . Unilateral primary osteoarthritis, right knee 10/29/2018  . Umbilical hernia 15/40/0867  . Strangulated umbilical hernia 61/95/0932    Past Surgical History:  Procedure Laterality Date  . KNEE ARTHROSCOPY    . UMBILICAL HERNIA REPAIR N/A 11/29/2017   Procedure: HERNIA REPAIR UMBILICAL ADULT;  Surgeon: Kinsinger, Arta Bruce, MD;  Location: [facility];  Service: General;  Laterality: N/A;    Allergies Banana; Bee venom; and Other  No family history on file.  Social History Social History   Tobacco Use  . Smoking status: Never Smoker  . Smokeless tobacco: Never  Used  Substance Use Topics  . Alcohol use: Not Currently  . Drug use: Never    Review of Systems  Constitutional: No fever/chills Eyes: No visual changes. ENT: No sore throat. Cardiovascular: Positive chest pain. Respiratory: Positive shortness of breath. Gastrointestinal: No abdominal pain.  No nausea, no vomiting.  No diarrhea.  No constipation. Genitourinary: Negative for dysuria. Musculoskeletal: Negative for back pain. Skin: Negative for rash. Neurological: Negative for headaches, focal weakness or numbness.  10-point ROS otherwise negative.  ____________________________________________   PHYSICAL EXAM:  VITAL SIGNS: ED Triage Vitals  Enc Vitals Group     BP 01/19/19 1100 125/80     Pulse Rate 01/19/19 1100 85     Resp 01/19/19 1100 18     Temp 01/19/19 1100 98.4 F (36.9 C)     Temp Source 01/19/19 1100 Oral     SpO2 01/19/19 1100 100 %     Weight 01/19/19 1058 260 lb (117.9 kg)     Height 01/19/19 1058 5\' 9"  (1.753 m)   Constitutional: Alert and oriented. Well appearing and in no acute distress. Eyes: Conjunctivae are normal.  Head: Atraumatic. Nose: No congestion/rhinnorhea. Mouth/Throat: Mucous membranes are moist.  Neck: No stridor.   Cardiovascular: Normal rate, regular rhythm. Good peripheral circulation. Grossly normal heart sounds.   Respiratory: Normal respiratory effort.  No retractions. Lungs CTAB. Gastrointestinal: Soft and nontender. No distention.  Musculoskeletal: No lower extremity tenderness nor edema. No gross deformities of extremities. Neurologic:  Normal speech and language. No gross focal neurologic deficits are appreciated.  Skin:  Skin is warm, dry and intact. No rash noted.  ____________________________________________   LABS (all labs ordered are listed, but only abnormal results are displayed)  Labs Reviewed  COMPREHENSIVE METABOLIC PANEL - Abnormal; Notable for the following components:      Result Value   Glucose, Bld 132  (*)    Calcium 8.4 (*)    Albumin 3.2 (*)    All other components within normal limits  CBC WITH DIFFERENTIAL/PLATELET - Abnormal; Notable for the following components:   Hemoglobin 12.1 (*)    Platelets 513 (*)    All other components within normal limits  TROPONIN I   ____________________________________________  EKG   EKG Interpretation  Date/Time:  Monday January 19 2019 10:52:08 EDT Ventricular Rate:  88 PR Interval:  164 QRS Duration: 92 QT Interval:  362 QTC Calculation: 438 R Axis:   15 Text Interpretation:  Normal sinus rhythm Normal ECG No STEMI.  Confirmed by Nanda Quinton [phone]) on 01/19/2019 11:23:05 AM       ____________________________________________  RADIOLOGY  Dg Chest 2 View  Result Date: 01/19/2019 CLINICAL DATA:  Chest pain and cough EXAM: CHEST - 2 VIEW COMPARISON:  January 07, 2019 FINDINGS: No edema or consolidation. Heart size and pulmonary vascularity are normal. No adenopathy. No pneumothorax. No bone lesions. IMPRESSION: No edema or consolidation. Electronically Signed   By: Lowella Grip III M.D.   On: 01/19/2019 12:06   Ct Angio Chest Pe W And/or Wo Contrast  Result Date: 01/19/2019 CLINICAL DATA:  Hx of diverticulitis and PE left lung recently. He was started on blood thinners. He is here today with SOB, cough and chest pain x 3 days. EKG at triage normal. His oxygen sat is 100%, he is speaking in complete sentences and appears in no respiratory distress. Pt is ambulatory. EXAM: CT ANGIOGRAPHY CHEST WITH CONTRAST TECHNIQUE: Multidetector CT imaging of the chest was performed using the standard protocol during bolus administration of intravenous contrast. Multiplanar CT image reconstructions and MIPs were obtained to evaluate the vascular anatomy. CONTRAST:  120mL ISOVUE-370 IOPAMIDOL (ISOVUE-370) INJECTION 76% COMPARISON:  None. FINDINGS: Cardiovascular: Heart size normal. No pericardial effusion. Satisfactory opacification of pulmonary arteries  noted, and there is no evidence of pulmonary emboli. Adequate contrast opacification of the thoracic aorta with no evidence of dissection, aneurysm, or stenosis. There is classic 3-vessel brachiocephalic arch anatomy without proximal stenosis. No significant atheromatous change. Visualized proximal abdominal aorta unremarkable. Mediastinum/Nodes: No hilar or mediastinal adenopathy. Lungs/Pleura: No pleural effusion. No pneumothorax. Lungs are clear. Upper Abdomen: No acute findings. Musculoskeletal: Degenerative change in bilateral shoulders. No fracture or worrisome bone lesion. Review of the MIP images confirms the above findings. IMPRESSION: Negative for acute PE or thoracic aortic dissection. Electronically Signed   By: Lucrezia Europe M.D.   On: 01/19/2019 13:53    ____________________________________________   PROCEDURES  Procedure(s) performed:   Procedures  None ____________________________________________   INITIAL IMPRESSION / ASSESSMENT AND PLAN / ED COURSE  Pertinent labs & imaging results that were available during my care of the patient were reviewed by me and considered in my medical decision making (see chart for details).  Patient with history of portal vein thrombosis recently started on Xarelto presents to the emergency department with chest pain and shortness of breath.  No significant pneumonia symptoms.  Patient has mild tachycardia at bedside but normal oxygen saturation.  Lower suspicion for hospital-acquired pneumonia or other infection.  Plan for labs and chest x-ray but patient will likely  require CT of the chest to evaluate for PE.  CTA and CXR reviewed. No acute findings on labs. Plan for supportive care and PCP follow up plan.  ____________________________________________  FINAL CLINICAL IMPRESSION(S) / ED DIAGNOSES  Final diagnoses:  Precordial chest pain  Shortness of breath     MEDICATIONS GIVEN DURING THIS VISIT:  Medications  sodium chloride 0.9 %  bolus 500 mL (0 mLs Intravenous Stopped 01/19/19 1404)  iopamidol (ISOVUE-370) 76 % injection 100 mL (100 mLs Intravenous Contrast Given 01/19/19 1246)    Note:  This document was prepared using Dragon voice recognition software and may include unintentional dictation errors.  Nanda Quinton, MD Emergency Medicine    Walter Min, [facility], MD 01/19/19 2042

## 2019-01-19 NOTE — ED Triage Notes (Signed)
Hx of diverticulitis and PE left lung recently. He was started on blood thinners. He is here today with SOB, cough and chest pain x 3 days. EKG at triage normal. His oxygen sat is 100%, he is speaking in complete sentences and appears in no respiratory distress. Pt is ambulatory.

## 2019-01-19 NOTE — Discharge Instructions (Signed)

## 2019-01-21 ENCOUNTER — Ambulatory Visit (INDEPENDENT_AMBULATORY_CARE_PROVIDER_SITE_OTHER): Payer: BLUE CROSS/BLUE SHIELD | Admitting: Internal Medicine

## 2019-01-21 ENCOUNTER — Encounter: Payer: Self-pay | Admitting: *Deleted

## 2019-01-21 ENCOUNTER — Other Ambulatory Visit: Payer: Self-pay

## 2019-01-21 VITALS — BP 120/80 | HR 93 | Temp 98.0°F | Ht 69.0 in | Wt 274.3 lb

## 2019-01-21 DIAGNOSIS — G4733 Obstructive sleep apnea (adult) (pediatric): Secondary | ICD-10-CM

## 2019-01-21 DIAGNOSIS — Z23 Encounter for immunization: Secondary | ICD-10-CM | POA: Diagnosis not present

## 2019-01-21 DIAGNOSIS — D509 Iron deficiency anemia, unspecified: Secondary | ICD-10-CM

## 2019-01-21 DIAGNOSIS — K5792 Diverticulitis of intestine, part unspecified, without perforation or abscess without bleeding: Secondary | ICD-10-CM

## 2019-01-21 DIAGNOSIS — K219 Gastro-esophageal reflux disease without esophagitis: Secondary | ICD-10-CM

## 2019-01-21 DIAGNOSIS — I81 Portal vein thrombosis: Secondary | ICD-10-CM

## 2019-01-21 DIAGNOSIS — K5909 Other constipation: Secondary | ICD-10-CM

## 2019-01-21 DIAGNOSIS — M170 Bilateral primary osteoarthritis of knee: Secondary | ICD-10-CM

## 2019-01-21 DIAGNOSIS — Z6841 Body Mass Index (BMI) 40.0 and over, adult: Secondary | ICD-10-CM

## 2019-01-21 MED ORDER — EPINEPHRINE 0.3 MG/0.3ML IJ SOAJ: 0.3000 mg | INTRAMUSCULAR | 0 refills | Status: AC | PRN

## 2019-01-21 NOTE — Telephone Encounter (Signed)
Patient has a new patient appointment scheduled 01/21/2019

## 2019-01-21 NOTE — Patient Instructions (Addendum)
Great meeting you today.  Instructions: -Refer to Hematology-blood provider -Refer to sports medicine for flat feet orthotics -Cont taking xarelto as prescribed -Take tylenol as needed for pain -EPI pens prescribed for bee sting allergies -Follow up soon for an annual physical exam, come fasting so we can draw blood work   Calorie Counting for Weight Loss Calories are units of energy. Your body needs a certain amount of calories from food to keep you going throughout the day. When you eat more calories than your body needs, your body stores the extra calories as fat. When you eat fewer calories than your body needs, your body burns fat to get the energy it needs. Calorie counting means keeping track of how many calories you eat and drink each day. Calorie counting can be helpful if you need to lose weight. If you make sure to eat fewer calories than your body needs, you should lose weight. Ask your health care provider what a healthy weight is for you. For calorie counting to work, you will need to eat the right number of calories in a day in order to lose a healthy amount of weight per week. A dietitian can help you determine how many calories you need in a day and will give you suggestions on how to reach your calorie goal.  A healthy amount of weight to lose per week is usually 1-2 lb (0.5-0.9 kg). This usually means that your daily calorie intake should be reduced by 500-750 calories.  Eating 1,200 - 1,500 calories per day can help most women lose weight.  Eating 1,500 - 1,800 calories per day can help most men lose weight. What is my plan? My goal is to have __________ calories per day. If I have this many calories per day, I should lose around __________ pounds per week. What do I need to know about calorie counting? In order to meet your daily calorie goal, you will need to:  Find out how many calories are in each food you would like to eat. Try to do this before you eat.  Decide  how much of the food you plan to eat.  Write down what you ate and how many calories it had. Doing this is called keeping a food log. To successfully lose weight, it is important to balance calorie counting with a healthy lifestyle that includes regular activity. Aim for 150 minutes of moderate exercise (such as walking) or 75 minutes of vigorous exercise (such as running) each week. Where do I find calorie information?  The number of calories in a food can be found on a Nutrition Facts label. If a food does not have a Nutrition Facts label, try to look up the calories online or ask your dietitian for help. Remember that calories are listed per serving. If you choose to have more than one serving of a food, you will have to multiply the calories per serving by the amount of servings you plan to eat. For example, the label on a package of bread might say that a serving size is 1 slice and that there are 90 calories in a serving. If you eat 1 slice, you will have eaten 90 calories. If you eat 2 slices, you will have eaten 180 calories. How do I keep a food log? Immediately after each meal, record the following information in your food log:  What you ate. Don't forget to include toppings, sauces, and other extras on the food.  How much you ate. This  can be measured in cups, ounces, or number of items.  How many calories each food and drink had.  The total number of calories in the meal. Keep your food log near you, such as in a small notebook in your pocket, or use a mobile app or website. Some programs will calculate calories for you and show you how many calories you have left for the day to meet your goal. What are some calorie counting tips?   Use your calories on foods and drinks that will fill you up and not leave you hungry: ? Some examples of foods that fill you up are nuts and nut butters, vegetables, lean proteins, and high-fiber foods like whole grains. High-fiber foods are foods with  more than 5 g fiber per serving. ? Drinks such as sodas, specialty coffee drinks, alcohol, and juices have a lot of calories, yet do not fill you up.  Eat nutritious foods and avoid empty calories. Empty calories are calories you get from foods or beverages that do not have many vitamins or protein, such as candy, sweets, and soda. It is better to have a nutritious high-calorie food (such as an avocado) than a food with few nutrients (such as a bag of chips).  Know how many calories are in the foods you eat most often. This will help you calculate calorie counts faster.  Pay attention to calories in drinks. Low-calorie drinks include water and unsweetened drinks.  Pay attention to nutrition labels for "low fat" or "fat free" foods. These foods sometimes have the same amount of calories or more calories than the full fat versions. They also often have added sugar, starch, or salt, to make up for flavor that was removed with the fat.  Find a way of tracking calories that works for you. Get creative. Try different apps or programs if writing down calories does not work for you. What are some portion control tips?  Know how many calories are in a serving. This will help you know how many servings of a certain food you can have.  Use a measuring cup to measure serving sizes. You could also try weighing out portions on a kitchen scale. With time, you will be able to estimate serving sizes for some foods.  Take some time to put servings of different foods on your favorite plates, bowls, and cups so you know what a serving looks like.  Try not to eat straight from a bag or box. Doing this can lead to overeating. Put the amount you would like to eat in a cup or on a plate to make sure you are eating the right portion.  Use smaller plates, glasses, and bowls to prevent overeating.  Try not to multitask (for example, watch TV or use your computer) while eating. If it is time to eat, sit down at a table  and enjoy your food. This will help you to know when you are full. It will also help you to be aware of what you are eating and how much you are eating. What are tips for following this plan? Reading food labels  Check the calorie count compared to the serving size. The serving size may be smaller than what you are used to eating.  Check the source of the calories. Make sure the food you are eating is high in vitamins and protein and low in saturated and trans fats. Shopping  Read nutrition labels while you shop. This will help you make healthy decisions before  you decide to purchase your food.  Make a grocery list and stick to it. Cooking  Try to cook your favorite foods in a healthier way. For example, try baking instead of frying.  Use low-fat dairy products. Meal planning  Use more fruits and vegetables. Half of your plate should be fruits and vegetables.  Include lean proteins like poultry and fish. How do I count calories when eating out?  Ask for smaller portion sizes.  Consider sharing an entree and sides instead of getting your own entree.  If you get your own entree, eat only half. Ask for a box at the beginning of your meal and put the rest of your entree in it so you are not tempted to eat it.  If calories are listed on the menu, choose the lower calorie options.  Choose dishes that include vegetables, fruits, whole grains, low-fat dairy products, and lean protein.  Choose items that are boiled, broiled, grilled, or steamed. Stay away from items that are buttered, battered, fried, or served with cream sauce. Items labeled "crispy" are usually fried, unless stated otherwise.  Choose water, low-fat milk, unsweetened iced tea, or other drinks without added sugar. If you want an alcoholic beverage, choose a lower calorie option such as a glass of wine or light beer.  Ask for dressings, sauces, and syrups on the side. These are usually high in calories, so you should  limit the amount you eat.  If you want a salad, choose a garden salad and ask for grilled meats. Avoid extra toppings like bacon, cheese, or fried items. Ask for the dressing on the side, or ask for olive oil and vinegar or lemon to use as dressing.  Estimate how many servings of a food you are given. For example, a serving of cooked rice is  cup or about the size of half a baseball. Knowing serving sizes will help you be aware of how much food you are eating at restaurants. The list below tells you how big or small some common portion sizes are based on everyday objects: ? 1 oz-4 stacked dice. ? 3 oz-1 deck of cards. ? 1 tsp-1 die. ? 1 Tbsp- a ping-pong ball. ? 2 Tbsp-1 ping-pong ball. ?  cup- baseball. ? 1 cup-1 baseball. Summary  Calorie counting means keeping track of how many calories you eat and drink each day. If you eat fewer calories than your body needs, you should lose weight.  A healthy amount of weight to lose per week is usually 1-2 lb (0.5-0.9 kg). This usually means reducing your daily calorie intake by 500-750 calories.  The number of calories in a food can be found on a Nutrition Facts label. If a food does not have a Nutrition Facts label, try to look up the calories online or ask your dietitian for help.  Use your calories on foods and drinks that will fill you up, and not on foods and drinks that will leave you hungry.  Use smaller plates, glasses, and bowls to prevent overeating. This information is not intended to replace advice given to you by your health care provider. Make sure you discuss any questions you have with your health care provider. Document Released: 10/29/2005 Document Revised: 07/18/2018 Document Reviewed: 09/28/2016 Elsevier Interactive Patient Education  2019 [employer].

## 2019-01-21 NOTE — Progress Notes (Signed)
New Patient Office Visit     CC/Reason for Visit: establish care Previous PCP: [facility] Last Visit: unknown, but years ago  HPI: Marc May is a 43 y.o. male who is coming in today for the above mentioned reasons.  Patient is here with his wife and they are adverse to using blood products (jehovah's witnesses). Past Medical History is significant for strangulated ventral hernia s/p emergent surgery, diverticulitis, asthma, OSA, recent diagnosis of portal vein thrombosis on xarelto, migraines, obese and bilateral knee OA, osteoarthritis.  Patient went to the hospital for diverticulitis and was admitted to receive antibiotics.  They also found a blood clot (splenic vein thrombosis) when he was admitted to the hospital.  Patient is taking xarelto 15mg  bid and has been on this for about a week.  Patient had emergency hernia surgery at [facility].  Patient has hx of osteoarthritis in his knees and wants to have knee replacement.  Would like epi pen refill (bee allergies), needs a note to release back to work. Would like to see someone for custom orthotics for his flat feet.   Past Medical/Surgical History: Past Medical History:  Diagnosis Date  . Asthma   . Diverticulitis   . Dizziness   . GERD (gastroesophageal reflux disease)   . Hernia of abdominal cavity   . History of blood clots   . Migraines   . Osteoarthritis    knees  . Sleep apnea     Past Surgical History:  Procedure Laterality Date  . KNEE ARTHROSCOPY  2012  . UMBILICAL HERNIA REPAIR N/A 11/29/2017   Procedure: HERNIA REPAIR UMBILICAL ADULT;  Surgeon: Kinsinger, Arta Bruce, MD;  Location: [facility];  Service: General;  Laterality: N/A;    Social History:  reports that he has never smoked. He has never used smokeless tobacco. He reports previous alcohol use. He reports that he does not use drugs.  Allergies: Allergies  Allergen Reactions  . Banana Anaphylaxis  . Bee Venom  Anaphylaxis  . Other Anaphylaxis    ANTIBIOTIC; pt does not remember which one    Family History:  Family History  Problem Relation Age of Onset  . Arthritis Mother   . Asthma Mother   . Cancer Mother   . Cancer Father   . Diabetes Father   . Arthritis Sister   . Alcohol abuse Brother   . Arthritis Brother   . Depression Brother   . Diabetes Brother   . Drug abuse Brother   . Arthritis Brother   . Arthritis Brother      Current Outpatient Medications:  .  Rivaroxaban (XARELTO) 15 MG TABS tablet, Take 1 tablet (15 mg total) by mouth 2 (two) times daily with a meal., Disp: 42 tablet, Rfl: 0 .  [START ON 02/02/2019] rivaroxaban (XARELTO) 20 MG TABS tablet, Take 1 tablet (20 mg total) by mouth daily with supper., Disp: 30 tablet, Rfl: 6 .  traMADol (ULTRAM) 50 MG tablet, Take 1-2 tablets (50-100 mg total) by mouth every 6 (six) hours as needed. (Patient taking differently: Take 50-100 mg by mouth every 6 (six) hours as needed for moderate pain. ), Disp: 30 tablet, Rfl: 0 .  benzonatate (TESSALON) 100 MG capsule, Take 100-200 mg by mouth every 8 (eight) hours as needed for cough., Disp: , Rfl:  .  EPINEPHrine 0.3 mg/0.3 mL IJ SOAJ injection, Inject 0.3 mLs (0.3 mg total) into the muscle as needed for anaphylaxis (bee stings)., Disp: 2 Device, Rfl:  0  Review of Systems:  Constitutional: Denies fever, chills, diaphoresis, appetite change and fatigue.  HEENT: Denies photophobia, eye pain, redness, hearing loss, ear pain, congestion, sore throat, rhinorrhea, sneezing, mouth sores, trouble swallowing, neck pain, neck stiffness and tinnitus.   Respiratory: Denies SOB, DOE, cough, chest tightness,  and wheezing.   Cardiovascular: Denies chest pain, palpitations and leg swelling.  Gastrointestinal: Denies nausea, vomiting, abdominal pain, diarrhea, constipation, blood in stool and abdominal distention.  Genitourinary: Denies dysuria, urgency, frequency, hematuria, flank pain and difficulty  urinating.  Endocrine: Denies: hot or cold intolerance, sweats, changes in May or nails, polyuria, polydipsia. Musculoskeletal: Denies back pain, joint swelling and gait problem. C/o of chronic knee pain Skin: Denies pallor, rash and wound.  Neurological: Denies dizziness, seizures, syncope, weakness, light-headedness, numbness and headaches.  Hematological: Denies adenopathy. Easy bruising, personal or family bleeding history  Psychiatric/Behavioral: Denies suicidal ideation, mood changes, confusion, nervousness, sleep disturbance and agitation   Physical Exam: Vitals:   01/21/19 1102  BP: 120/80  Pulse: 93  Temp: 98 F (36.7 C)  TempSrc: Oral  SpO2: 96%  Weight: 274 lb 4.8 oz (124.4 kg)  Height: 5\' 9"  (1.753 m)   Body mass index is 40.51 kg/m.  Constitutional: NAD, calm, comfortable Eyes: PERRL, lids and conjunctivae normal Respiratory: clear to auscultation bilaterally, no wheezing, no crackles. Normal respiratory effort. No accessory muscle use.  Cardiovascular: Regular rate and rhythm, no murmurs / rubs / gallops. No extremity edema. 2+ pedal pulses. No carotid bruits.   Abd: S/NT/ND/+BS Psychiatric: Normal judgment and insight. Alert and oriented x 3. Normal mood.    Impression and Plan:  Portal vein thrombosis -Continue xarelto -Ambulatory referral to Hematology (? Duration of anticoagulation and ?need for hypercoagulable work up.  OSA (obstructive sleep apnea)   Gastroesophageal reflux disease without esophagitis -Not currently on medications  Primary osteoarthritis of both knees -Ambulatory referral to Sports Medicine (would like custom orthotics for his flat feet).  Morbid obesity with BMI of 40.0-44.9, adult ([place]) Handout on diet and nutrition Discussed lifestyle modifications  Diverticulitis -completed abx therapy, low-residue diet. -Would benefit from gen surg referral for consideration of colectomy given multiple episodes, but wait until off  anticoagulation.  Needs flu shot - Plan:  Flu Vaccine QUAD 6+ mos PF IM (Fluarix Quad PF)  Need for diphtheria-tetanus-pertussis (Tdap) vaccine - Plan:  -Tdap vaccine greater than or equal to 7yo IM      Patient Instructions  Great meeting you today.  Instructions: -Refer to Hematology-blood provider -Refer to sports medicine for flat feet orthotics -Cont taking xarelto as prescribed -Take tylenol as needed for pain -EPI pens prescribed for bee sting allergies -Follow up soon for an annual physical exam, come fasting so we can draw blood work   Calorie Counting for Weight Loss Calories are units of energy. Your body needs a certain amount of calories from food to keep you going throughout the day. When you eat more calories than your body needs, your body stores the extra calories as fat. When you eat fewer calories than your body needs, your body burns fat to get the energy it needs. Calorie counting means keeping track of how many calories you eat and drink each day. Calorie counting can be helpful if you need to lose weight. If you make sure to eat fewer calories than your body needs, you should lose weight. Ask your health care provider what a healthy weight is for you. For calorie counting to work, you will need  to eat the right number of calories in a day in order to lose a healthy amount of weight per week. A dietitian can help you determine how many calories you need in a day and will give you suggestions on how to reach your calorie goal.  A healthy amount of weight to lose per week is usually 1-2 lb (0.5-0.9 kg). This usually means that your daily calorie intake should be reduced by 500-750 calories.  Eating 1,200 - 1,500 calories per day can help most women lose weight.  Eating 1,500 - 1,800 calories per day can help most men lose weight. What is my plan? My goal is to have __________ calories per day. If I have this many calories per day, I should lose around  __________ pounds per week. What do I need to know about calorie counting? In order to meet your daily calorie goal, you will need to:  Find out how many calories are in each food you would like to eat. Try to do this before you eat.  Decide how much of the food you plan to eat.  Write down what you ate and how many calories it had. Doing this is called keeping a food log. To successfully lose weight, it is important to balance calorie counting with a healthy lifestyle that includes regular activity. Aim for 150 minutes of moderate exercise (such as walking) or 75 minutes of vigorous exercise (such as running) each week. Where do I find calorie information?  The number of calories in a food can be found on a Nutrition Facts label. If a food does not have a Nutrition Facts label, try to look up the calories online or ask your dietitian for help. Remember that calories are listed per serving. If you choose to have more than one serving of a food, you will have to multiply the calories per serving by the amount of servings you plan to eat. For example, the label on a package of bread might say that a serving size is 1 slice and that there are 90 calories in a serving. If you eat 1 slice, you will have eaten 90 calories. If you eat 2 slices, you will have eaten 180 calories. How do I keep a food log? Immediately after each meal, record the following information in your food log:  What you ate. Don't forget to include toppings, sauces, and other extras on the food.  How much you ate. This can be measured in cups, ounces, or number of items.  How many calories each food and drink had.  The total number of calories in the meal. Keep your food log near you, such as in a small notebook in your pocket, or use a mobile app or website. Some programs will calculate calories for you and show you how many calories you have left for the day to meet your goal. What are some calorie counting tips?   Use  your calories on foods and drinks that will fill you up and not leave you hungry: ? Some examples of foods that fill you up are nuts and nut butters, vegetables, lean proteins, and high-fiber foods like whole grains. High-fiber foods are foods with more than 5 g fiber per serving. ? Drinks such as sodas, specialty coffee drinks, alcohol, and juices have a lot of calories, yet do not fill you up.  Eat nutritious foods and avoid empty calories. Empty calories are calories you get from foods or beverages that do not have many  vitamins or protein, such as candy, sweets, and soda. It is better to have a nutritious high-calorie food (such as an avocado) than a food with few nutrients (such as a bag of chips).  Know how many calories are in the foods you eat most often. This will help you calculate calorie counts faster.  Pay attention to calories in drinks. Low-calorie drinks include water and unsweetened drinks.  Pay attention to nutrition labels for "low fat" or "fat free" foods. These foods sometimes have the same amount of calories or more calories than the full fat versions. They also often have added sugar, starch, or salt, to make up for flavor that was removed with the fat.  Find a way of tracking calories that works for you. Get creative. Try different apps or programs if writing down calories does not work for you. What are some portion control tips?  Know how many calories are in a serving. This will help you know how many servings of a certain food you can have.  Use a measuring cup to measure serving sizes. You could also try weighing out portions on a kitchen scale. With time, you will be able to estimate serving sizes for some foods.  Take some time to put servings of different foods on your favorite plates, bowls, and cups so you know what a serving looks like.  Try not to eat straight from a bag or box. Doing this can lead to overeating. Put the amount you would like to eat in a cup  or on a plate to make sure you are eating the right portion.  Use smaller plates, glasses, and bowls to prevent overeating.  Try not to multitask (for example, watch TV or use your computer) while eating. If it is time to eat, sit down at a table and enjoy your food. This will help you to know when you are full. It will also help you to be aware of what you are eating and how much you are eating. What are tips for following this plan? Reading food labels  Check the calorie count compared to the serving size. The serving size may be smaller than what you are used to eating.  Check the source of the calories. Make sure the food you are eating is high in vitamins and protein and low in saturated and trans fats. Shopping  Read nutrition labels while you shop. This will help you make healthy decisions before you decide to purchase your food.  Make a grocery list and stick to it. Cooking  Try to cook your favorite foods in a healthier way. For example, try baking instead of frying.  Use low-fat dairy products. Meal planning  Use more fruits and vegetables. Half of your plate should be fruits and vegetables.  Include lean proteins like poultry and fish. How do I count calories when eating out?  Ask for smaller portion sizes.  Consider sharing an entree and sides instead of getting your own entree.  If you get your own entree, eat only half. Ask for a box at the beginning of your meal and put the rest of your entree in it so you are not tempted to eat it.  If calories are listed on the menu, choose the lower calorie options.  Choose dishes that include vegetables, fruits, whole grains, low-fat dairy products, and lean protein.  Choose items that are boiled, broiled, grilled, or steamed. Stay away from items that are buttered, battered, fried, or served with cream  sauce. Items labeled "crispy" are usually fried, unless stated otherwise.  Choose water, low-fat milk, unsweetened iced  tea, or other drinks without added sugar. If you want an alcoholic beverage, choose a lower calorie option such as a glass of wine or light beer.  Ask for dressings, sauces, and syrups on the side. These are usually high in calories, so you should limit the amount you eat.  If you want a salad, choose a garden salad and ask for grilled meats. Avoid extra toppings like bacon, cheese, or fried items. Ask for the dressing on the side, or ask for olive oil and vinegar or lemon to use as dressing.  Estimate how many servings of a food you are given. For example, a serving of cooked rice is  cup or about the size of half a baseball. Knowing serving sizes will help you be aware of how much food you are eating at restaurants. The list below tells you how big or small some common portion sizes are based on everyday objects: ? 1 oz-4 stacked dice. ? 3 oz-1 deck of cards. ? 1 tsp-1 die. ? 1 Tbsp- a ping-pong ball. ? 2 Tbsp-1 ping-pong ball. ?  cup- baseball. ? 1 cup-1 baseball. Summary  Calorie counting means keeping track of how many calories you eat and drink each day. If you eat fewer calories than your body needs, you should lose weight.  A healthy amount of weight to lose per week is usually 1-2 lb (0.5-0.9 kg). This usually means reducing your daily calorie intake by 500-750 calories.  The number of calories in a food can be found on a Nutrition Facts label. If a food does not have a Nutrition Facts label, try to look up the calories online or ask your dietitian for help.  Use your calories on foods and drinks that will fill you up, and not on foods and drinks that will leave you hungry.  Use smaller plates, glasses, and bowls to prevent overeating. This information is not intended to replace advice given to you by your health care provider. Make sure you discuss any questions you have with your health care provider. Document Released: 10/29/2005 Document Revised: 07/18/2018 Document  Reviewed: 09/28/2016 Elsevier Interactive Patient Education  2019 [place], RN DNP Student [facility]

## 2019-02-02 ENCOUNTER — Telehealth (INDEPENDENT_AMBULATORY_CARE_PROVIDER_SITE_OTHER): Payer: Self-pay

## 2019-02-02 NOTE — Telephone Encounter (Signed)
Called patient and asked the screening questions.  Do you have now or have you had in the past 7 days a fever and/or chills? NO  Do you have now or have you had in the past 7 days a cough? NO  Do you have now or have you had in the last 7 days nausea, vomiting or abdominal pain? NO  Have you been exposed to anyone who has tested positive for COVID-19? NO  Have you or anyone who lives with you traveled within the last month? NO 

## 2019-02-03 ENCOUNTER — Other Ambulatory Visit: Payer: Self-pay

## 2019-02-03 ENCOUNTER — Ambulatory Visit (INDEPENDENT_AMBULATORY_CARE_PROVIDER_SITE_OTHER): Payer: BLUE CROSS/BLUE SHIELD | Admitting: Orthopaedic Surgery

## 2019-02-03 VITALS — Ht 69.0 in | Wt 273.0 lb

## 2019-02-03 DIAGNOSIS — M25561 Pain in right knee: Secondary | ICD-10-CM | POA: Diagnosis not present

## 2019-02-03 DIAGNOSIS — M25562 Pain in left knee: Secondary | ICD-10-CM | POA: Diagnosis not present

## 2019-02-03 DIAGNOSIS — G8929 Other chronic pain: Secondary | ICD-10-CM | POA: Diagnosis not present

## 2019-02-03 DIAGNOSIS — M1712 Unilateral primary osteoarthritis, left knee: Secondary | ICD-10-CM | POA: Diagnosis not present

## 2019-02-03 DIAGNOSIS — M1711 Unilateral primary osteoarthritis, right knee: Secondary | ICD-10-CM | POA: Diagnosis not present

## 2019-02-03 MED ORDER — METHYLPREDNISOLONE ACETATE 40 MG/ML IJ SUSP
40.0000 mg | INTRAMUSCULAR | Status: AC | PRN
  Administered 2019-02-03: 40 mg via INTRA_ARTICULAR

## 2019-02-03 MED ORDER — LIDOCAINE HCL 1 % IJ SOLN
3.0000 mL | INTRAMUSCULAR | Status: AC | PRN
  Administered 2019-02-03: 3 mL

## 2019-02-03 MED ORDER — DICLOFENAC SODIUM 1 % TD GEL: 2.0000 g | Freq: Four times a day (QID) | TRANSDERMAL | 3 refills | Status: DC

## 2019-02-03 NOTE — Progress Notes (Signed)
Office Visit Note   Patient: Marc May           Date of Birth: Dec 03, 1975           MRN: [id number] Visit Date: 02/03/2019              Requested by: Isaac Bliss, Rayford Halsted, MD [place],  [id number] PCP: Isaac Bliss, Rayford Halsted, MD   Assessment & Plan: Visit Diagnoses:  1. Unilateral primary osteoarthritis, left knee   2. Unilateral primary osteoarthritis, right knee   3. Chronic pain of left knee   4. Chronic pain of right knee     Plan: I was able to successfully place steroid injections in both knees today.  He tolerated these well.  I have encouraged him to continue to lose weight.  We will see him back in a month to see how he is doing overall and to see if he has any information on the duration of his blood thinning medication.  I did give him reassurance that even with blood thinning medication we would be able to perform surgery at some point but the bigger issue is just getting his weight loss to be a little bit more so to have a BMI or we can successfully schedule the surgery.  He understands that as well.  I would like to see him back in 4 weeks to see how he is doing overall.  I did send in some Voltaren gel that hopefully his insurance will cover.  Follow-Up Instructions: Return in about 4 weeks (around 03/03/2019).   Orders:  Orders Placed This Encounter  Procedures  . Large Joint Inj: R knee  . Large Joint Inj: L knee   Meds ordered this encounter  Medications  . diclofenac sodium (VOLTAREN) 1 % GEL    Sig: Apply 2 g topically 4 (four) times daily.    Dispense:  100 g    Refill:  3      Procedures: Large Joint Inj: R knee on 02/03/2019 10:30 AM Indications: diagnostic evaluation and pain Details: 22 G 1.5 in needle, superolateral approach  Arthrogram: No  Medications: 3 mL lidocaine 1 %; 40 mg methylPREDNISolone acetate 40 MG/ML Outcome: tolerated well, no immediate complications Procedure, treatment alternatives,  risks and benefits explained, specific risks discussed. Consent was given by the patient. Immediately prior to procedure a time out was called to verify the correct patient, procedure, equipment, support staff and site/side marked as required. Patient was prepped and draped in the usual sterile fashion.   Large Joint Inj: L knee on 02/03/2019 10:31 AM Indications: diagnostic evaluation and pain Details: 22 G 1.5 in needle, superolateral approach  Arthrogram: No  Medications: 3 mL lidocaine 1 %; 40 mg methylPREDNISolone acetate 40 MG/ML Outcome: tolerated well, no immediate complications Procedure, treatment alternatives, risks and benefits explained, specific risks discussed. Consent was given by the patient. Immediately prior to procedure a time out was called to verify the correct patient, procedure, equipment, support staff and site/side marked as required. Patient was prepped and draped in the usual sterile fashion.       Clinical Data: No additional findings.   Subjective: Chief Complaint  Patient presents with  . Left Knee - Pain  . Right Knee - Pain  The patient so not seen before.  He is a very pleasant 43 year old gentleman with severe debilitating arthritis of both his knees.  He is recently been hospitalized and was found to have a pulmonary embolus.  He is now on Xarelto for this.  He is scheduled to have a visit with a hematologist soon to get a better idea of what the cause of his PE is.  He has well-documented end-stage arthritis of both his knees.  He is open to have at least a steroid injection in his knees today.  At some point he we have talked about knee replacement surgery but however this is going to be on hold given the coronavirus pandemic and given the fact that he is now recently been placed on blood thinning medication.  Also his BMI is 40.  He knows we need to get it down just a little bit more before proceeding with knee replacement surgery.  Surprisingly he does  not appear obese to that extent on my exam at all.  I think is more of a height issue but his knees and legs are thin.  HPI  Review of Systems He is currently denies any shortness of breath, fever, chills, nausea, vomiting, chest pain.  Objective: Vital Signs: Ht 5\' 9"  (1.753 m)   Wt 273 lb (123.8 kg)   BMI 40.32 kg/m   Physical Exam He is alert and orient x3 and in no acute distress Ortho Exam Examination of both knees shows varus malalignment with significant medial joint line tenderness.  There is severe patellofemoral crepitation with both knees.  Both knees are ligamentously stable with good range of motion. Specialty Comments:  No specialty comments available.  Imaging: No results found. Previous x-rays of both knees show severe end-stage arthritis with varus malalignment.  There is complete medial joint space loss with para-articular osteophytes in all 3 compartments.  PMFS History: Patient Active Problem List   Diagnosis Date Noted  . Microcytic anemia 01/07/2019  . Portal vein thrombosis 01/06/2019  . Acute diverticulitis 01/06/2019  . Hyponatremia 01/06/2019  . OSA (obstructive sleep apnea) 01/06/2019  . Umbilical hernia 84/16/6063  . Strangulated umbilical hernia 01/60/1093  . Morbid obesity with BMI of 40.0-44.9, adult ([place]) 02/28/2016  . Gastroesophageal reflux disease without esophagitis 02/28/2016  . Primary osteoarthritis of both knees 02/28/2016  . Chronic constipation 02/28/2016   Past Medical History:  Diagnosis Date  . Asthma   . Diverticulitis   . Dizziness   . GERD (gastroesophageal reflux disease)   . Hernia of abdominal cavity   . History of blood clots   . Migraines   . Osteoarthritis    knees  . Sleep apnea     Family History  Problem Relation Age of Onset  . Arthritis Mother   . Asthma Mother   . Cancer Mother   . Cancer Father   . Diabetes Father   . Arthritis Sister   . Alcohol abuse Brother   . Arthritis Brother   .  Depression Brother   . Diabetes Brother   . Drug abuse Brother   . Arthritis Brother   . Arthritis Brother     Past Surgical History:  Procedure Laterality Date  . KNEE ARTHROSCOPY  2012  . UMBILICAL HERNIA REPAIR N/A 11/29/2017   Procedure: HERNIA REPAIR UMBILICAL ADULT;  Surgeon: Kinsinger, Arta Bruce, MD;  Location: [facility];  Service: General;  Laterality: N/A;   Social History   Occupational History  . Not on file  Tobacco Use  . Smoking status: Never Smoker  . Smokeless tobacco: Never Used  Substance and Sexual Activity  . Alcohol use: Not Currently  . Drug use: Never  . Sexual activity: Not on file

## 2019-02-11 DIAGNOSIS — M79671 Pain in right foot: Secondary | ICD-10-CM | POA: Diagnosis not present

## 2019-02-23 ENCOUNTER — Telehealth: Payer: Self-pay | Admitting: *Deleted

## 2019-02-23 NOTE — Telephone Encounter (Signed)
Left message on machine for patient that appointment has been cancelled.

## 2019-02-23 NOTE — Telephone Encounter (Signed)
Copied from [place] [phone]. Topic: Appointment Scheduling - Scheduling Inquiry for Clinic >> Feb 20, 2019 11:43 AM Alanda Slim E wrote: Reason for CRM: Pt wants to cancel his 02/25/19 appt with Dr. Jerilee Hoh due to it being a physical will reschedule after covid 51 settles

## 2019-02-25 ENCOUNTER — Encounter: Payer: BLUE CROSS/BLUE SHIELD | Admitting: Internal Medicine

## 2019-02-25 ENCOUNTER — Ambulatory Visit (INDEPENDENT_AMBULATORY_CARE_PROVIDER_SITE_OTHER): Payer: BLUE CROSS/BLUE SHIELD | Admitting: Orthopaedic Surgery

## 2019-02-26 DIAGNOSIS — M5413 Radiculopathy, cervicothoracic region: Secondary | ICD-10-CM | POA: Diagnosis not present

## 2019-02-26 DIAGNOSIS — M5417 Radiculopathy, lumbosacral region: Secondary | ICD-10-CM | POA: Diagnosis not present

## 2019-02-26 DIAGNOSIS — M9903 Segmental and somatic dysfunction of lumbar region: Secondary | ICD-10-CM | POA: Diagnosis not present

## 2019-02-26 DIAGNOSIS — M9901 Segmental and somatic dysfunction of cervical region: Secondary | ICD-10-CM | POA: Diagnosis not present

## 2019-03-02 DIAGNOSIS — M9903 Segmental and somatic dysfunction of lumbar region: Secondary | ICD-10-CM | POA: Diagnosis not present

## 2019-03-02 DIAGNOSIS — M9901 Segmental and somatic dysfunction of cervical region: Secondary | ICD-10-CM | POA: Diagnosis not present

## 2019-03-02 DIAGNOSIS — M5413 Radiculopathy, cervicothoracic region: Secondary | ICD-10-CM | POA: Diagnosis not present

## 2019-03-02 DIAGNOSIS — M5417 Radiculopathy, lumbosacral region: Secondary | ICD-10-CM | POA: Diagnosis not present

## 2019-03-03 DIAGNOSIS — G4733 Obstructive sleep apnea (adult) (pediatric): Secondary | ICD-10-CM | POA: Diagnosis not present

## 2019-03-04 DIAGNOSIS — M5413 Radiculopathy, cervicothoracic region: Secondary | ICD-10-CM | POA: Diagnosis not present

## 2019-03-04 DIAGNOSIS — M9903 Segmental and somatic dysfunction of lumbar region: Secondary | ICD-10-CM | POA: Diagnosis not present

## 2019-03-04 DIAGNOSIS — M5417 Radiculopathy, lumbosacral region: Secondary | ICD-10-CM | POA: Diagnosis not present

## 2019-03-04 DIAGNOSIS — M9901 Segmental and somatic dysfunction of cervical region: Secondary | ICD-10-CM | POA: Diagnosis not present

## 2019-03-05 NOTE — Progress Notes (Signed)
[facility] NOTE  Patient Care Team: Isaac Bliss, Rayford Halsted, MD as PCP - General (Internal Medicine)  HEME/ONC OVERVIEW: 1. R portal vein thrombosis -Late 12/2018: CT AP (for abdominal pain) showed acute diverticulitis and an incidental partial R portal vein thrombosis; treated with Lovenox and discharged home w/ Xarelto -01/2019: portal vein thrombosis unchanged on CT   TREATMENT REGIMEN:  01/12/2019 - present: Xarelto 20mg  daily  ASSESSMENT & PLAN:   R portal vein thrombosis -I reviewed the patient's records in detail, including recent hospitalization notes, lab studies, and imaging results -Also independently reviewed the radiologic images of recent CT abdomen/pelvis, and agree with the finding of documented -In summary, patient presented to the ED in 12/2018 for evaluation of abdominal pain.  CT showed acute diverticulitis and an incidental partial right portal vein thrombosis.  Patient was treated with Lovenox, and discharged home with Xarelto.  He returned to the ER in 01/2019 for evaluation of chest pain, and CT showed stable right portal vein thrombosis.  There was no evidence of new thrombosis on CTA chest or lower extremity Doppler. -I reviewed the imaging results in detail with the patient  -I personally reviewed the patient's peripheral blood smear today.  The red blood cells were of normal morphology.  There was no schistocytosis.  The white blood cells were of normal morphology. There were no peripheral circulating blasts. The platelets were of normal size and I verified that there were no platelet clumping. -I discussed some of the potential causes of portal vein thrombosis. Portal vein thrombosis can be associated with intra-abdominal inflammation/infection, and given his recent acute diverticulitis, this can certainly be a precipitating factor for the thrombosis. Less commonly, myeloproliferative neoplasm (MPN), such as ET or PV, as well as paroxysmal  nocturnal hemoglobinuria (PNH), can lead to thrombosis in unusual locations, such as the portal vein -Review of the patient's CBC's showed intermittent leukocytosis, and since the recent hospitalization, thrombocytosis -While less likely on the differential, it would be reasonable to rule out MPN and PNH -Therefore, I have ordered MPN NGS and PNH flow to ruel out those disorders, respectively -Given the absence of personal or family history of VTE, testing for thrombophilia, such as Factor V Leiden, prothrombin gene mutation or APLS, is likely low yield; therefore, we will hold off testing for inherited thrombophilia at this time  -If the work-up is unremarkable, then the patient will be treated for a presumed provoked PVT secondary to intra-abdominal inflammation, and the duration of anticoagulation should be at least 3 months (ie at least early 04/2019) -I discussed with the patient that routine imaging surveillance is generally not recommended to monitor for resolution of venous thrombosis; however, given the unusual location of the thrombosis and the patient's concern regarding possible persistence (or lack thereof), I have ordered CT abdomen/pelvis w/ contrast for early 04/2019 to assess for any interval changes   History of recurrent diverticulitis, hematochezia -Patient reports that he was being evaluated for possible May resection due to recurrent diverticulitis in [place] before he moved to [facility] -He also reported one episode of hematochezia after having a couple of drinks in 02/2019 that resolved spontaneously; no recurrence since then -In light of the patient's family history of gastric cancer, I have referred the patient to gastroenterology for further evaluation   Orders Placed This Encounter  Procedures  . CT ABDOMEN PELVIS W CONTRAST    Standing Status:   Future    Standing Expiration Date:   03/09/2020  Order Specific Question:   ** REASON FOR EXAM (FREE TEXT)    Answer:    Portal vein thrombosis, follow-up scan on anticoagulation    Order Specific Question:   If indicated for the ordered procedure, I authorize the administration of contrast media per Radiology protocol    Answer:   Yes    Order Specific Question:   Preferred imaging location?    Answer:   [facility] Specific Question:   Is Oral Contrast requested for this exam?    Answer:   Yes, Per Radiology protocol    Order Specific Question:   Radiology Contrast Protocol - do NOT remove file path    Answer:   \\[email or web address].[email or web address]  . CBC with Differential ([place] Only)    Standing Status:   Future    Standing Expiration Date:   04/13/2020  . CMP ([place] only)    Standing Status:   Future    Standing Expiration Date:   04/13/2020  . Ambulatory referral to Gastroenterology    Referral Priority:   Routine    Referral Type:   Consultation    Referral Reason:   Specialty Services Required    Referred to Provider:   Lavena Bullion, DO    Number of Visits Requested:   1   All questions were answered. The patient knows to call the clinic with any problems, questions or concerns.  Return in 04/2019 to follow up above testing, labs, imaging results and clinic appt.   Tish Men, MD 03/10/2019 1:10 PM  CHIEF COMPLAINTS/PURPOSE OF CONSULTATION:  "I am doing fine"  HISTORY OF PRESENTING ILLNESS:  Marc May 43 y.o. male is here because of newly diagnosed R portal vein thrombosis.  Patient presented to [facility] ER in 12/2018 for abdominal pain, nausea, vomiting, and diarrhea.  CT abdomen/pelvis showed acute diverticulitis.  Incidentally, he also showed a new partial right portal vein thrombosis.  He was admitted for IV antibiotics, and discharged home with Xarelto 20mg  daily.  He returned to the ER in 01/2019 for left-sided chest pain. CTA chest was done, which was negative for acute PTE and showed stable R portal vein thrombosis.   Patient reports that he  has been tolerating Xarelto relatively well without any significant abnormal bleeding or bruising.  In 02/2019, he had couple of drinks on his wedding anniversary, and noticed some bright red blood in the stool after having drinks.  He denied any associated abdominal pain, nausea, vomiting, or melena.  Since then, he has not had any recurrent hematochezia or melena.  He has had family history of gastric cancer, but he has never had any upper endoscopy in the past.  His last colonoscopy was 5 years ago in [place].  Finally, he has chronic osteoarthritis in bilateral knees, for which he was initially scheduled to have bilateral knee replacement, but the surgery has been delayed due to COVID-19 outbreak.  Of note, patient is Jehovah's Witness.  MEDICAL HISTORY:  Past Medical History:  Diagnosis Date  . Asthma   . Diverticulitis   . Dizziness   . GERD (gastroesophageal reflux disease)   . Hernia of abdominal cavity   . History of blood clots   . Migraines   . Osteoarthritis    knees  . Sleep apnea     SURGICAL HISTORY: Past Surgical History:  Procedure Laterality Date  . KNEE ARTHROSCOPY  2012  . UMBILICAL HERNIA REPAIR N/A 11/29/2017   Procedure: HERNIA REPAIR  UMBILICAL ADULT;  Surgeon: Kinsinger, Arta Bruce, MD;  Location: [place];  Service: General;  Laterality: N/A;    SOCIAL HISTORY: Social History   Socioeconomic History  . Marital status: Married    Spouse name: Not on file  . Number of children: Not on file  . Years of education: Not on file  . Highest education level: Not on file  Occupational History  . Not on file  Social Needs  . Financial resource strain: Not on file  . Food insecurity:    Worry: Not on file    Inability: Not on file  . Transportation needs:    Medical: Not on file    Non-medical: Not on file  Tobacco Use  . Smoking status: Never Smoker  . Smokeless tobacco: Never Used  Substance and Sexual Activity  . Alcohol use: Not Currently  . Drug  use: Never  . Sexual activity: Not on file  Lifestyle  . Physical activity:    Days per week: Not on file    Minutes per session: Not on file  . Stress: Not on file  Relationships  . Social connections:    Talks on phone: Not on file    Gets together: Not on file    Attends religious service: Not on file    Active member of club or organization: Not on file    Attends meetings of clubs or organizations: Not on file    Relationship status: Not on file  . Intimate partner violence:    Fear of current or ex partner: Not on file    Emotionally abused: Not on file    Physically abused: Not on file    Forced sexual activity: Not on file  Other Topics Concern  . Not on file  Social History Narrative  . Not on file    FAMILY HISTORY: Family History  Problem Relation Age of Onset  . Arthritis Mother   . Asthma Mother   . Cancer Mother   . Cancer Father   . Diabetes Father   . Arthritis Sister   . Alcohol abuse Brother   . Arthritis Brother   . Depression Brother   . Diabetes Brother   . Drug abuse Brother   . Arthritis Brother   . Arthritis Brother     ALLERGIES:  is allergic to banana; bee venom; and other.  MEDICATIONS:  Current Outpatient Medications  Medication Sig Dispense Refill  . benzonatate (TESSALON) 100 MG capsule Take 100-200 mg by mouth every 8 (eight) hours as needed for cough.    . diclofenac sodium (VOLTAREN) 1 % GEL Apply 2 g topically 4 (four) times daily. 100 g 3  . EPINEPHrine 0.3 mg/0.3 mL IJ SOAJ injection Inject 0.3 mLs (0.3 mg total) into the muscle as needed for anaphylaxis (bee stings). 2 Device 0  . rivaroxaban (XARELTO) 20 MG TABS tablet Take 1 tablet (20 mg total) by mouth daily with supper. 30 tablet 6  . traMADol (ULTRAM) 50 MG tablet Take 1-2 tablets (50-100 mg total) by mouth every 6 (six) hours as needed. (Patient taking differently: Take 50-100 mg by mouth every 6 (six) hours as needed for moderate pain. ) 30 tablet 0   No current  facility-administered medications for this visit.     REVIEW OF SYSTEMS:   Constitutional: ( - ) fevers, ( - )  chills , ( - ) night sweats Eyes: ( - ) blurriness of vision, ( - ) double vision, ( - )  watery eyes Ears, nose, mouth, throat, and face: ( - ) mucositis, ( - ) sore throat Respiratory: ( - ) cough, ( - ) dyspnea, ( - ) wheezes Cardiovascular: ( - ) palpitation, ( - ) chest discomfort, ( - ) lower extremity swelling Gastrointestinal:  ( - ) nausea, ( - ) heartburn, ( + ) change in bowel habits Skin: ( - ) abnormal skin rashes Lymphatics: ( - ) new lymphadenopathy, ( - ) easy bruising Neurological: ( - ) numbness, ( - ) tingling, ( - ) new weaknesses Behavioral/Psych: ( - ) mood change, ( - ) new changes  All other systems were reviewed with the patient and are negative.  PHYSICAL EXAMINATION: ECOG PERFORMANCE STATUS: 0 - Asymptomatic  Vitals:   03/10/19 1217  BP: 128/79  Pulse: 63  Resp: 19  Temp: 98 F (36.7 C)  SpO2: 100%   Filed Weights   03/10/19 1217  Weight: 289 lb 12.8 oz (131.5 kg)    GENERAL: alert, no distress and comfortable, well appearing SKIN: skin color, texture, turgor are normal, no rashes or significant lesions EYES: conjunctiva are pink and non-injected, sclera clear OROPHARYNX: no exudate, no erythema; lips, buccal mucosa, and tongue normal  NECK: supple, non-tender LUNGS: clear to auscultation with normal breathing effort HEART: regular rate & rhythm, no murmurs, no lower extremity edema ABDOMEN: soft, non-tender, non-distended, normal bowel sounds Musculoskeletal: no cyanosis of digits and no clubbing  PSYCH: alert & oriented x 3, fluent speech NEURO: no focal motor/sensory deficits  LABORATORY DATA:  I have reviewed the data as listed Lab Results  Component Value Date   WBC 7.2 03/10/2019   HGB 14.2 03/10/2019   HCT 44.0 03/10/2019   MCV 84.5 03/10/2019   PLT 311 03/10/2019   Lab Results  Component Value Date   NA 138  03/10/2019   K 4.5 03/10/2019   CL 102 03/10/2019   CO2 30 03/10/2019    RADIOGRAPHIC STUDIES: I have personally reviewed the radiological images as listed and agreed with the findings in the report. No results found.  PATHOLOGY: I personally reviewed the patient's peripheral blood smear today.  The red blood cells were of normal morphology.  There was no schistocytosis.  The white blood cells were of normal morphology. There were no peripheral circulating blasts. The platelets were of normal size and I verified that there were no platelet clumping.

## 2019-03-06 ENCOUNTER — Other Ambulatory Visit: Payer: Self-pay | Admitting: Hematology

## 2019-03-06 DIAGNOSIS — I81 Portal vein thrombosis: Secondary | ICD-10-CM

## 2019-03-10 ENCOUNTER — Inpatient Hospital Stay: Payer: BLUE CROSS/BLUE SHIELD

## 2019-03-10 ENCOUNTER — Encounter: Payer: Self-pay | Admitting: Hematology

## 2019-03-10 ENCOUNTER — Other Ambulatory Visit: Payer: Self-pay

## 2019-03-10 ENCOUNTER — Inpatient Hospital Stay: Payer: BLUE CROSS/BLUE SHIELD | Attending: Hematology | Admitting: Hematology

## 2019-03-10 VITALS — BP 128/79 | HR 63 | Temp 98.0°F | Resp 19 | Ht 70.0 in | Wt 289.8 lb

## 2019-03-10 DIAGNOSIS — D72829 Elevated white blood cell count, unspecified: Secondary | ICD-10-CM | POA: Diagnosis not present

## 2019-03-10 DIAGNOSIS — Z86718 Personal history of other venous thrombosis and embolism: Secondary | ICD-10-CM

## 2019-03-10 DIAGNOSIS — Z7901 Long term (current) use of anticoagulants: Secondary | ICD-10-CM | POA: Diagnosis not present

## 2019-03-10 DIAGNOSIS — I81 Portal vein thrombosis: Secondary | ICD-10-CM | POA: Insufficient documentation

## 2019-03-10 DIAGNOSIS — R7989 Other specified abnormal findings of blood chemistry: Secondary | ICD-10-CM

## 2019-03-10 DIAGNOSIS — Z79899 Other long term (current) drug therapy: Secondary | ICD-10-CM | POA: Diagnosis not present

## 2019-03-10 DIAGNOSIS — K921 Melena: Secondary | ICD-10-CM

## 2019-03-10 LAB — CMP (CANCER CENTER ONLY)
ALT: 30 U/L (ref 0–44)
AST: 20 U/L (ref 15–41)
Albumin: 4.3 g/dL (ref 3.5–5.0)
Alkaline Phosphatase: 93 U/L (ref 38–126)
Anion gap: 6 (ref 5–15)
BUN: 10 mg/dL (ref 6–20)
CO2: 30 mmol/L (ref 22–32)
Calcium: 10.1 mg/dL (ref 8.9–10.3)
Chloride: 102 mmol/L (ref 98–111)
Creatinine: 0.9 mg/dL (ref 0.61–1.24)
GFR, Est AFR Am: >60 mL/min (ref >60)
GFR, Estimated: >60 mL/min (ref >60)
Glucose, Bld: 84 mg/dL (ref 70–99)
Potassium: 4.5 mmol/L (ref 3.5–5.1)
Sodium: 138 mmol/L (ref 135–145)
Total Bilirubin: 0.3 mg/dL (ref 0.3–1.2)
Total Protein: 7.7 g/dL (ref 6.5–8.1)

## 2019-03-10 LAB — SAVE SMEAR(SSMR), FOR PROVIDER SLIDE REVIEW

## 2019-03-10 LAB — CBC WITH DIFFERENTIAL (CANCER CENTER ONLY)
Abs Immature Granulocytes: 0.05 10*3/uL (ref 0.00–0.07)
Basophils Absolute: 0.1 10*3/uL (ref 0.0–0.1)
Basophils Relative: 1 %
Eosinophils Absolute: 0.1 10*3/uL (ref 0.0–0.5)
Eosinophils Relative: 1 %
HCT: 44 % (ref 39.0–52.0)
Hemoglobin: 14.2 g/dL (ref 13.0–17.0)
Immature Granulocytes: 1 %
Lymphocytes Relative: 24 %
Lymphs Abs: 1.7 10*3/uL (ref 0.7–4.0)
MCH: 27.3 pg (ref 26.0–34.0)
MCHC: 32.3 g/dL (ref 30.0–36.0)
MCV: 84.5 fL (ref 80.0–100.0)
Monocytes Absolute: 0.8 10*3/uL (ref 0.1–1.0)
Monocytes Relative: 11 %
Neutro Abs: 4.5 10*3/uL (ref 1.7–7.7)
Neutrophils Relative %: 62 %
Platelet Count: 311 10*3/uL (ref 150–400)
RBC: 5.21 MIL/uL (ref 4.22–5.81)
RDW: 13.7 % (ref 11.5–15.5)
WBC Count: 7.2 10*3/uL (ref 4.0–10.5)
nRBC: 0 % (ref 0.0–0.2)

## 2019-03-11 DIAGNOSIS — M5413 Radiculopathy, cervicothoracic region: Secondary | ICD-10-CM | POA: Diagnosis not present

## 2019-03-11 DIAGNOSIS — M9901 Segmental and somatic dysfunction of cervical region: Secondary | ICD-10-CM | POA: Diagnosis not present

## 2019-03-11 DIAGNOSIS — M9903 Segmental and somatic dysfunction of lumbar region: Secondary | ICD-10-CM | POA: Diagnosis not present

## 2019-03-11 DIAGNOSIS — M5417 Radiculopathy, lumbosacral region: Secondary | ICD-10-CM | POA: Diagnosis not present

## 2019-03-12 ENCOUNTER — Telehealth: Payer: Self-pay | Admitting: Hematology

## 2019-03-12 NOTE — Telephone Encounter (Signed)
Appointments scheduled letter/calendar mailed  °

## 2019-03-13 DIAGNOSIS — M9901 Segmental and somatic dysfunction of cervical region: Secondary | ICD-10-CM | POA: Diagnosis not present

## 2019-03-13 DIAGNOSIS — M5417 Radiculopathy, lumbosacral region: Secondary | ICD-10-CM | POA: Diagnosis not present

## 2019-03-13 DIAGNOSIS — M9903 Segmental and somatic dysfunction of lumbar region: Secondary | ICD-10-CM | POA: Diagnosis not present

## 2019-03-13 DIAGNOSIS — M5413 Radiculopathy, cervicothoracic region: Secondary | ICD-10-CM | POA: Diagnosis not present

## 2019-03-13 LAB — PNH PROFILE (-HIGH SENSITIVITY): Viability:: 98

## 2019-03-16 DIAGNOSIS — M5413 Radiculopathy, cervicothoracic region: Secondary | ICD-10-CM | POA: Diagnosis not present

## 2019-03-16 DIAGNOSIS — M5417 Radiculopathy, lumbosacral region: Secondary | ICD-10-CM | POA: Diagnosis not present

## 2019-03-16 DIAGNOSIS — M9903 Segmental and somatic dysfunction of lumbar region: Secondary | ICD-10-CM | POA: Diagnosis not present

## 2019-03-16 DIAGNOSIS — M9901 Segmental and somatic dysfunction of cervical region: Secondary | ICD-10-CM | POA: Diagnosis not present

## 2019-03-18 DIAGNOSIS — M9903 Segmental and somatic dysfunction of lumbar region: Secondary | ICD-10-CM | POA: Diagnosis not present

## 2019-03-18 DIAGNOSIS — M5413 Radiculopathy, cervicothoracic region: Secondary | ICD-10-CM | POA: Diagnosis not present

## 2019-03-18 DIAGNOSIS — M9901 Segmental and somatic dysfunction of cervical region: Secondary | ICD-10-CM | POA: Diagnosis not present

## 2019-03-18 DIAGNOSIS — M5417 Radiculopathy, lumbosacral region: Secondary | ICD-10-CM | POA: Diagnosis not present

## 2019-03-23 DIAGNOSIS — M5413 Radiculopathy, cervicothoracic region: Secondary | ICD-10-CM | POA: Diagnosis not present

## 2019-03-23 DIAGNOSIS — M9901 Segmental and somatic dysfunction of cervical region: Secondary | ICD-10-CM | POA: Diagnosis not present

## 2019-03-23 DIAGNOSIS — M5417 Radiculopathy, lumbosacral region: Secondary | ICD-10-CM | POA: Diagnosis not present

## 2019-03-23 DIAGNOSIS — M9903 Segmental and somatic dysfunction of lumbar region: Secondary | ICD-10-CM | POA: Diagnosis not present

## 2019-03-25 DIAGNOSIS — M9903 Segmental and somatic dysfunction of lumbar region: Secondary | ICD-10-CM | POA: Diagnosis not present

## 2019-03-25 DIAGNOSIS — M9901 Segmental and somatic dysfunction of cervical region: Secondary | ICD-10-CM | POA: Diagnosis not present

## 2019-03-25 DIAGNOSIS — M5413 Radiculopathy, cervicothoracic region: Secondary | ICD-10-CM | POA: Diagnosis not present

## 2019-03-25 DIAGNOSIS — M5417 Radiculopathy, lumbosacral region: Secondary | ICD-10-CM | POA: Diagnosis not present

## 2019-03-25 LAB — JAK2 (INCLUDING V617F AND EXON 12), MPL,& CALR W/RFL MPN PANEL (NGS)

## 2019-03-27 DIAGNOSIS — M5413 Radiculopathy, cervicothoracic region: Secondary | ICD-10-CM | POA: Diagnosis not present

## 2019-03-27 DIAGNOSIS — M5417 Radiculopathy, lumbosacral region: Secondary | ICD-10-CM | POA: Diagnosis not present

## 2019-03-27 DIAGNOSIS — M9901 Segmental and somatic dysfunction of cervical region: Secondary | ICD-10-CM | POA: Diagnosis not present

## 2019-03-27 DIAGNOSIS — M9903 Segmental and somatic dysfunction of lumbar region: Secondary | ICD-10-CM | POA: Diagnosis not present

## 2019-04-23 ENCOUNTER — Other Ambulatory Visit: Payer: Self-pay

## 2019-04-23 ENCOUNTER — Ambulatory Visit (HOSPITAL_BASED_OUTPATIENT_CLINIC_OR_DEPARTMENT_OTHER)
Admission: RE | Admit: 2019-04-23 | Discharge: 2019-04-23 | Disposition: A | Discharge status: Home / Self Care | Payer: BC Managed Care – PPO | Source: Ambulatory Visit | Attending: Hematology | Admitting: Hematology

## 2019-04-23 ENCOUNTER — Encounter (HOSPITAL_BASED_OUTPATIENT_CLINIC_OR_DEPARTMENT_OTHER): Payer: Self-pay

## 2019-04-23 DIAGNOSIS — K5730 Diverticulosis of large intestine without perforation or abscess without bleeding: Secondary | ICD-10-CM | POA: Diagnosis not present

## 2019-04-23 DIAGNOSIS — I81 Portal vein thrombosis: Secondary | ICD-10-CM

## 2019-04-23 MED ORDER — IOHEXOL 300 MG/ML  SOLN
100.0000 mL | Freq: Once | INTRAMUSCULAR | Status: AC | PRN
  Administered 2019-04-23: 100 mL via INTRAVENOUS

## 2019-04-23 NOTE — Progress Notes (Signed)
[facility] OFFICE PROGRESS NOTE  Patient Care Team: Isaac Bliss, Rayford Halsted, MD as PCP - General (Internal Medicine)  HEME/ONC OVERVIEW: 1. R portal vein thrombosis, most likely due to acute intra-abdominal inflammation -Late 12/2018: CT AP (for abdominal pain) showed acute diverticulitis and an incidental partial R portal vein thrombosis; treated with Lovenox and discharged home w/ Xarelto -01/2019: portal vein thrombosis unchanged on CT; MPN NGS and PNH analysis negative  -04/2019: CT CAP showed persistent R portal vein thrombosis, albeit less apparent   TREATMENT REGIMEN:  01/12/2019 - present: Xarelto 20mg  daily  ASSESSMENT & PLAN:   R portal vein thrombosis -Most likely due to intra-abdominal inflammation in the setting of recent acute diverticulitis -I independently reviewed the radiologic images of CT abdomen/pelvis in 04/2019, and agreed with the findings as documented -In summary, CT AP after 3 months of Xarelto showed persistent R portal vein thrombosis, albeit less apparent  -I reviewed the molecular and imaging studies in detail with the patient -Given the recent acute diverticulitis, I suspect that the R portal vein thrombosis is most likely due to acute intra-abdominal inflammation  -To rule out other hypercoagulable disorders, I have ordered Factor V Leiden, PT mutation or APLS -If no hypercoagulable disorder is identified, then we will plan for at least 6 months of anticoagulation (ie until 07/2019) -In general, if recanalization occurs, it typically does so within six months of starting anticoagulation; in patients without an indication for lifelong anticoagulation, it is not clear if continuing anticoagulation beyond six months would lead to recanalization if it has not occurred by that time -Therefore, in these patients, we can consider discontinuing anticoagulation after six months of treatment and monitor for any recurrent symptoms, such as abdominal  pain -We will also plan to repeat CT abdomen/pelvis in 3 months after discontinuing anticoagulation to monitor for any changes   Abnormal sigmoid colon -Patient has hx of recurrent diverticulitis, and had been referred for surgery in [place]; however, since moving to [facility], he has not yet seen a Psychologist, sport and exercise -CT abdomen/pelvis in 04/2019 showed a persistent filling defect with wall thickening involving a short segment of sigmoid colon, possibly persistent diverticulitis vs. Malignancy -He was referred to GI in 02/2019 but the appt has not yet been scheduled; he was instructed to schedule his appt with GI ASAP and he expressed understanding   Orders Placed This Encounter  Procedures  . CT ABDOMEN PELVIS W CONTRAST    Standing Status:   Future    Standing Expiration Date:   04/23/2020    Order Specific Question:   If indicated for the ordered procedure, I authorize the administration of contrast media per Radiology protocol    Answer:   Yes    Order Specific Question:   Preferred imaging location?    Answer:   [facility] Specific Question:   Is Oral Contrast requested for this exam?    Answer:   Yes, Per Radiology protocol    Order Specific Question:   Radiology Contrast Protocol - do NOT remove file path    Answer:   \\[email or web address].[email or web address]  . CBC with Differential ([place] Only)    Standing Status:   Future    Standing Expiration Date:   05/28/2020  . CMP ([place] only)    Standing Status:   Future    Standing Expiration Date:   05/28/2020  . D-dimer, quantitative (not at [facility])    Standing Status:   Future  Standing Expiration Date:   05/28/2020   All questions were answered. The patient knows to call the clinic with any problems, questions or concerns. No barriers to learning was detected.  A total of more than 25 minutes were spent face-to-face with the patient during this encounter and over half of that time was spent on counseling  and coordination of care as outlined above.   Return in 3 months for labs, imaging results and clinic follow-up.   Tish Men, MD 04/24/2019 2:14 PM  CHIEF COMPLAINT: "I still have some periodic discomfort"  INTERVAL HISTORY: Mr. Colquhoun returns to clinic for follow-up of portal vein thrombosis on Xarelto.  Patient with still has a chronic bilateral knee pain, as well as some intermittent abdominal discomfort after eating, which he attributes to constipation.  He takes daily MiraLAX which seems to help with the discomfort.  He has been tolerating Xarelto well without any abnormal bleeding or bruising.  He reports that his last colonoscopy was 3 years ago in [place].  He denies any nausea, vomiting, diarrhea, hematochezia, or melena.  REVIEW OF SYSTEMS:   Constitutional: ( - ) fevers, ( - )  chills , ( - ) night sweats Eyes: ( - ) blurriness of vision, ( - ) double vision, ( - ) watery eyes Ears, nose, mouth, throat, and face: ( - ) mucositis, ( - ) sore throat Respiratory: ( - ) cough, ( - ) dyspnea, ( - ) wheezes Cardiovascular: ( - ) palpitation, ( - ) chest discomfort, ( - ) lower extremity swelling Gastrointestinal:  ( - ) nausea, ( - ) heartburn, ( + ) change in bowel habits Skin: ( - ) abnormal skin rashes Lymphatics: ( - ) new lymphadenopathy, ( - ) easy bruising Neurological: ( - ) numbness, ( - ) tingling, ( - ) new weaknesses Behavioral/Psych: ( - ) mood change, ( - ) new changes  All other systems were reviewed with the patient and are negative.  I have reviewed the past medical history, past surgical history, social history and family history with the patient and they are unchanged from previous note.  ALLERGIES:  is allergic to banana; bee venom; and other.  MEDICATIONS:  Current Outpatient Medications  Medication Sig Dispense Refill  . benzonatate (TESSALON) 100 MG capsule Take 100-200 mg by mouth every 8 (eight) hours as needed for cough.    . diclofenac sodium  (VOLTAREN) 1 % GEL Apply 2 g topically 4 (four) times daily. 100 g 3  . EPINEPHrine 0.3 mg/0.3 mL IJ SOAJ injection Inject 0.3 mLs (0.3 mg total) into the muscle as needed for anaphylaxis (bee stings). 2 Device 0  . rivaroxaban (XARELTO) 20 MG TABS tablet Take 1 tablet (20 mg total) by mouth daily with supper. 30 tablet 6  . traMADol (ULTRAM) 50 MG tablet Take 1-2 tablets (50-100 mg total) by mouth every 6 (six) hours as needed. (Patient taking differently: Take 50-100 mg by mouth every 6 (six) hours as needed for moderate pain. ) 30 tablet 0   No current facility-administered medications for this visit.     PHYSICAL EXAMINATION: ECOG PERFORMANCE STATUS: 1 - Symptomatic but completely ambulatory  Today's Vitals   04/24/19 1350  BP: 135/82  Pulse: 80  Resp: 19  TempSrc: Oral  SpO2: 99%  Weight: 296 lb 6.4 oz (134.4 kg)   Body mass index is 42.53 kg/m.  Filed Weights   04/24/19 1350  Weight: 296 lb 6.4 oz (134.4 kg)  GENERAL: alert, no distress and comfortable SKIN: skin color, texture, turgor are normal, no rashes or significant lesions EYES: conjunctiva are pink and non-injected, sclera clear  NECK: supple, non-tender LUNGS: clear to auscultation with normal breathing effort HEART: regular rate & rhythm and no murmurs and no lower extremity edema ABDOMEN: soft, non-tender, non-distended, normal bowel sounds Musculoskeletal: no cyanosis of digits and no clubbing  PSYCH: alert & oriented x 3, fluent speech NEURO: no focal motor/sensory deficits  LABORATORY DATA:  I have reviewed the data as listed    Component Value Date/Time   NA 140 04/24/2019 1328   K 4.3 04/24/2019 1328   CL 103 04/24/2019 1328   CO2 28 04/24/2019 1328   GLUCOSE 106 (H) 04/24/2019 1328   BUN 11 04/24/2019 1328   CREATININE 0.94 04/24/2019 1328   CALCIUM 9.6 04/24/2019 1328   PROT 7.7 04/24/2019 1328   ALBUMIN 4.5 04/24/2019 1328   AST 18 04/24/2019 1328   ALT 25 04/24/2019 1328   ALKPHOS 93  04/24/2019 1328   BILITOT 0.4 04/24/2019 1328   GFRNONAA >60 04/24/2019 1328   GFRAA >60 04/24/2019 1328    No results found for: SPEP, UPEP  Lab Results  Component Value Date   WBC 8.0 04/24/2019   NEUTROABS 5.4 04/24/2019   HGB 15.0 04/24/2019   HCT 46.4 04/24/2019   MCV 83.0 04/24/2019   PLT 336 04/24/2019      Chemistry      Component Value Date/Time   NA 140 04/24/2019 1328   K 4.3 04/24/2019 1328   CL 103 04/24/2019 1328   CO2 28 04/24/2019 1328   BUN 11 04/24/2019 1328   CREATININE 0.94 04/24/2019 1328      Component Value Date/Time   CALCIUM 9.6 04/24/2019 1328   ALKPHOS 93 04/24/2019 1328   AST 18 04/24/2019 1328   ALT 25 04/24/2019 1328   BILITOT 0.4 04/24/2019 1328       RADIOGRAPHIC STUDIES: I have personally reviewed the radiological images as listed below and agreed with the findings in the report. Ct Abdomen Pelvis W Contrast  Result Date: 04/23/2019 CLINICAL DATA:  Follow-up portal vein thrombosis. History of diverticulitis. EXAM: CT ABDOMEN AND PELVIS WITH CONTRAST TECHNIQUE: Multidetector CT imaging of the abdomen and pelvis was performed using the standard protocol following bolus administration of intravenous contrast. CONTRAST:  173mL OMNIPAQUE IOHEXOL 300 MG/ML  SOLN COMPARISON:  01/11/2019 and 01/06/2019 as well as 11/29/2017 FINDINGS: Lower chest: Lung bases are normal. Hepatobiliary: Gallbladder and biliary tree are normal. Liver parenchyma is within normal. There is continued evidence of right portal vein thrombosis which is slightly less apparent with less low-density over the portal vein. Pancreas: Normal. Spleen: Normal. Adrenals/Urinary Tract: Adrenal glands are normal. Kidneys are normal in size without hydronephrosis or nephrolithiasis. Ureters and bladder are normal. Stomach/Bowel: Stomach and small bowel are normal. Appendix is normal. Diverticulosis of the colon. No significant pericolonic inflammation. There is persistent filling defect  with wall thickening involving a short segment of sigmoid colon. There are a few small adjacent mesenteric lymph nodes. Findings are concerning for underlying colonic neoplasm. Vascular/Lymphatic: Abdominal aorta is within normal. Right portal vein thrombus as described above. Few small pericolonic lymph nodes as described above. Reproductive: Normal. Other: Focal area of fat necrosis over the midline anterior mesentery of the mid abdomen unchanged. Postsurgical change over the umbilical region. No free fluid or acute inflammatory change. Musculoskeletal: Unchanged. IMPRESSION: 1. Known right portal vein thrombosis persists, although somewhat less apparent.  2. Focal masslike wall thickening over the sigmoid colon with few small adjacent mesenteric lymph nodes. Findings are concerning for underlying colonic malignancy. Recommend endoscopic correlation. 3. Stable fat necrosis over the anterior mesentery of the mid abdomen. 4.  Colonic diverticulosis. Electronically Signed   By: Marin Olp M.D.   On: 04/23/2019 11:06

## 2019-04-24 ENCOUNTER — Encounter: Payer: Self-pay | Admitting: Hematology

## 2019-04-24 ENCOUNTER — Inpatient Hospital Stay: Payer: BLUE CROSS/BLUE SHIELD | Admitting: Hematology

## 2019-04-24 ENCOUNTER — Other Ambulatory Visit: Payer: Self-pay | Admitting: Hematology

## 2019-04-24 ENCOUNTER — Inpatient Hospital Stay: Payer: Self-pay | Attending: Hematology

## 2019-04-24 DIAGNOSIS — R933 Abnormal findings on diagnostic imaging of other parts of digestive tract: Secondary | ICD-10-CM | POA: Diagnosis not present

## 2019-04-24 DIAGNOSIS — I81 Portal vein thrombosis: Secondary | ICD-10-CM

## 2019-04-24 DIAGNOSIS — Z7901 Long term (current) use of anticoagulants: Secondary | ICD-10-CM

## 2019-04-24 LAB — CMP (CANCER CENTER ONLY)
ALT: 25 U/L (ref 0–44)
AST: 18 U/L (ref 15–41)
Albumin: 4.5 g/dL (ref 3.5–5.0)
Alkaline Phosphatase: 93 U/L (ref 38–126)
Anion gap: 9 (ref 5–15)
BUN: 11 mg/dL (ref 6–20)
CO2: 28 mmol/L (ref 22–32)
Calcium: 9.6 mg/dL (ref 8.9–10.3)
Chloride: 103 mmol/L (ref 98–111)
Creatinine: 0.94 mg/dL (ref 0.61–1.24)
GFR, Est AFR Am: >60 mL/min (ref >60)
GFR, Estimated: >60 mL/min (ref >60)
Glucose, Bld: 106 mg/dL — ABNORMAL HIGH (ref 70–99)
Potassium: 4.3 mmol/L (ref 3.5–5.1)
Sodium: 140 mmol/L (ref 135–145)
Total Bilirubin: 0.4 mg/dL (ref 0.3–1.2)
Total Protein: 7.7 g/dL (ref 6.5–8.1)

## 2019-04-24 LAB — CBC WITH DIFFERENTIAL (CANCER CENTER ONLY)
Abs Immature Granulocytes: 0.02 10*3/uL (ref 0.00–0.07)
Basophils Absolute: 0.1 10*3/uL (ref 0.0–0.1)
Basophils Relative: 1 %
Eosinophils Absolute: 0.2 10*3/uL (ref 0.0–0.5)
Eosinophils Relative: 2 %
HCT: 46.4 % (ref 39.0–52.0)
Hemoglobin: 15 g/dL (ref 13.0–17.0)
Immature Granulocytes: 0 %
Lymphocytes Relative: 21 %
Lymphs Abs: 1.7 10*3/uL (ref 0.7–4.0)
MCH: 26.8 pg (ref 26.0–34.0)
MCHC: 32.3 g/dL (ref 30.0–36.0)
MCV: 83 fL (ref 80.0–100.0)
Monocytes Absolute: 0.7 10*3/uL (ref 0.1–1.0)
Monocytes Relative: 9 %
Neutro Abs: 5.4 10*3/uL (ref 1.7–7.7)
Neutrophils Relative %: 67 %
Platelet Count: 336 10*3/uL (ref 150–400)
RBC: 5.59 MIL/uL (ref 4.22–5.81)
RDW: 12.9 % (ref 11.5–15.5)
WBC Count: 8 10*3/uL (ref 4.0–10.5)
nRBC: 0 % (ref 0.0–0.2)

## 2019-04-25 LAB — CARDIOLIPIN ANTIBODIES, IGG, IGM, IGA
Anticardiolipin IgA: <9 APL U/mL (ref 0–11)
Anticardiolipin IgG: <9 GPL U/mL (ref 0–14)
Anticardiolipin IgM: <9 MPL U/mL (ref 0–12)

## 2019-04-25 LAB — BETA-2-GLYCOPROTEIN I ABS, IGG/M/A
Beta-2 Glyco I IgG: <9 GPI IgG units (ref 0–20)
Beta-2-Glycoprotein I IgA: <9 GPI IgA units (ref 0–25)
Beta-2-Glycoprotein I IgM: <9 GPI IgM units (ref 0–32)

## 2019-04-27 ENCOUNTER — Telehealth (INDEPENDENT_AMBULATORY_CARE_PROVIDER_SITE_OTHER): Payer: BC Managed Care – PPO | Admitting: Gastroenterology

## 2019-04-27 ENCOUNTER — Other Ambulatory Visit: Payer: Self-pay

## 2019-04-27 ENCOUNTER — Encounter: Payer: Self-pay | Admitting: Gastroenterology

## 2019-04-27 ENCOUNTER — Telehealth: Payer: Self-pay

## 2019-04-27 ENCOUNTER — Telehealth: Payer: Self-pay | Admitting: Hematology

## 2019-04-27 DIAGNOSIS — Z86010 Personal history of colonic polyps: Secondary | ICD-10-CM

## 2019-04-27 DIAGNOSIS — I81 Portal vein thrombosis: Secondary | ICD-10-CM

## 2019-04-27 DIAGNOSIS — K5732 Diverticulitis of large intestine without perforation or abscess without bleeding: Secondary | ICD-10-CM | POA: Diagnosis not present

## 2019-04-27 DIAGNOSIS — R9389 Abnormal findings on diagnostic imaging of other specified body structures: Secondary | ICD-10-CM | POA: Diagnosis not present

## 2019-04-27 DIAGNOSIS — K219 Gastro-esophageal reflux disease without esophagitis: Secondary | ICD-10-CM

## 2019-04-27 DIAGNOSIS — K921 Melena: Secondary | ICD-10-CM

## 2019-04-27 DIAGNOSIS — Z7901 Long term (current) use of anticoagulants: Secondary | ICD-10-CM

## 2019-04-27 NOTE — Telephone Encounter (Signed)
[facility] Pre-operative Risk Assessment     Request for surgical clearance:     Endoscopy Procedure  What type of surgery is being performed?     EGD/Colonoscopy   When is this surgery scheduled?     04/30/2019  What type of clearance is required ?   Cardiac  Are there any medications that need to be held prior to surgery and how long? Xarelto for 2 days prior  Practice name and name of physician performing surgery?      [facility] V. Cirigliano, DO  What is your office phone and fax number?      Phone- [phone]  [phone]  Anesthesia type (None, local, MAC, general) ?       MAC

## 2019-04-27 NOTE — Telephone Encounter (Signed)
With regard to the anticoagulation management, okay to hold Xarelto 2 days prior to the procedure, and resume 1-2 days after the procedure, pending if biopsy is required.   For cardiac clearance, please contact the patient's PCP.  Thank you.  Dr. Maylon Peppers

## 2019-04-27 NOTE — Telephone Encounter (Signed)
LM for patient regarding appointment date/time per 6/12 los

## 2019-04-27 NOTE — Progress Notes (Signed)
Chief Complaint: Hematochezia, recurrent diverticulitis, Abnormal CT, GERD  Referring Provider:     Tish Men, MD   HPI:    Due to current restrictions/limitations of in-office visits due to the COVID-19 pandemic, this scheduled clinical appointment was converted to a telehealth virtual consultation using D[employer].  -The patient did consent to this virtual visit and is aware of possible charges through their insurance for this visit.  -Names of all parties present: Marc May (patient), Gerrit Heck, DO, [facility] (physician) -Patient location: Home -Physician location: Office  Marc May is a 43 y.o. male with a history of right portal vein thrombosis, most likely due to acute diverticulitis earlier this year, currently treated with Xarelto and following with Dr.  Maylon Peppers in Hematology, referred to the Gastroenterology Clinic for evaluation of hematochezia and recurrent diverticulitis.  Hypercoagulable work-up ordered by Hematologist.  History of recurrent diverticulitis and was apparently referred for surgery in [place], but has since moved to [facility].  He was admitted in 12/2018 with recurrence of acute diverticulitis, c/b right PV thrombosis as above. Repeat CT earlier this month demonstrates resolution of diverticulitis but with persistent filling defect and wall thickening in the sigmoid colon with a few small adjacent LNs, c/f malignant etiology.    Today, he states first episode was 5 years ago, and has been treated with Abx approx 5 times, and other more mild episodes treated at home with fasting/rest. He cancelled his prior w/u and surgery due to mother with CA diagnosed at that time. Can still feel LLQ discomfort in between episodes.  Constipation for approximately 5 years as well.  Takes Miralax most days/week. Drinks >>64 oz water/day. Hematochezia x2 episodes, with most recent occurring after starting Xarelto in March 2020. No recurrence.   Colonoscopy approx 5  years ago in [place] after initial episode n/f polyps, with recommendation to repeat in 2 years. No repeat since then.   Mother with Ovarian CA and diverticulitis. Father with GI issues, but unsure of dx. PGF with Esophageal CA. No known CRC or IBD.   No EtOH or tobacco. Rare red meat.   Separately, hx of reflux, which is generally well controlled daily PPI therapy.  Has rare breakthrough symptoms with dietary indiscretions (spicy foods).  Index symptoms of heartburn, regurgitation.  No dysphagia.  No prior EGD.  Recent GI evaluation: - Normal CBC and CMP last week.  Normal iron panel, B12, folate in 3/20. - CT (12/2018): Right PV thrombosis, decreased liver attenuation/steatosis, sigmoid diverticulosis and short segment sigmoid circumferential wall thickening - RUQ [place] (12/2018): Echogenic hepatic parenchyma with 2.7 cm hypoechoic left lobe region representing focal fatty sparing versus neoplasm.  Recommend follow-up imaging - MRI liver (12/2018): Fatty liver, right PV thrombosis.  No focal left hepatic lesion - CT (01/2019): Right PV thrombus, diverticulosis without diverticulitis - CT (04/2019): Normal liver, continued right PV thrombosis which is slightly less apparent, diverticulosis without pericolonic inflammation, but there is persistent filling defect with wall thickening involving a short segment of the sigmoid colon with a few small adjacent mesenteric LN's concerning for underlying neoplasm  Past medical history, past surgical history, social history, family history, medications, and allergies reviewed in the chart and with patient.    Past Medical History:  Diagnosis Date  . Asthma   . Diverticulitis   . Dizziness   . GERD (gastroesophageal reflux disease)   . Hernia of abdominal cavity   . History of blood  clots   . Migraines   . Osteoarthritis    knees  . Sleep apnea      Past Surgical History:  Procedure Laterality Date  . KNEE ARTHROSCOPY  2012  . UMBILICAL HERNIA REPAIR N/A  11/29/2017   Procedure: HERNIA REPAIR UMBILICAL ADULT;  Surgeon: Kinsinger, Arta Bruce, MD;  Location: [place];  Service: General;  Laterality: N/A;   Family History  Problem Relation Age of Onset  . Arthritis Mother   . Asthma Mother   . Ovarian cancer Mother   . Diverticulitis Mother   . Cancer Father   . Diabetes Father   . Arthritis Sister   . Alcohol abuse Brother   . Arthritis Brother   . Depression Brother   . Diabetes Brother   . Drug abuse Brother   . Esophageal cancer Paternal Grandfather   . Arthritis Brother   . Arthritis Brother   . Colon cancer Neg Hx    Social History   Tobacco Use  . Smoking status: Never Smoker  . Smokeless tobacco: Never Used  Substance Use Topics  . Alcohol use: Not Currently  . Drug use: Never   Current Outpatient Medications  Medication Sig Dispense Refill  . diclofenac sodium (VOLTAREN) 1 % GEL Apply 2 g topically 4 (four) times daily. 100 g 3  . EPINEPHrine 0.3 mg/0.3 mL IJ SOAJ injection Inject 0.3 mLs (0.3 mg total) into the muscle as needed for anaphylaxis (bee stings). 2 Device 0  . rivaroxaban (XARELTO) 20 MG TABS tablet Take 1 tablet (20 mg total) by mouth daily with supper. 30 tablet 6  . traMADol (ULTRAM) 50 MG tablet Take 1-2 tablets (50-100 mg total) by mouth every 6 (six) hours as needed. (Patient not taking: Reported on 04/27/2019) 30 tablet 0   No current facility-administered medications for this visit.    Allergies  Allergen Reactions  . Banana Anaphylaxis  . Bee Venom Anaphylaxis  . Other Anaphylaxis    ANTIBIOTIC; pt does not remember which one     Review of Systems: All systems reviewed and negative except where noted in HPI.     Physical Exam:    Complete physical exam not completed due to the nature of this telehealth communication.   Gen: Awake, alert, and oriented, and well communicative. HEENT: EOMI, non-icteric sclera, NCAT, MMM Neck: Normal movement of head and neck Pulm: No labored breathing,  speaking in full sentences without conversational dyspnea Derm: No apparent lesions or bruising in visible field MS: Moves all visible extremities without noticeable abnormality Psych: Pleasant, cooperative, normal speech, thought processing seemingly intact   ASSESSMENT AND PLAN;   1) Recurrent Diverticulitis 2) Hematochezia 3) Abnormal imaging study (CT) 4) Segmental thickening of the Sigmoid Colon 5) History of colon polyps  CT findings concerning for malignant etiology.  Discussed his clinical history along with recent CT findings at length with the patient today.  Colon polyps on index colonoscopy approximately 5 years ago.  Unsure of size, location, number, histology, but was recommended repeat in 2 years, which she never completed.  - Expedited colonoscopy this week -Hold Xarelto 2 days prior to procedure  6) GERD: - Requesting EGD for Barrett's screening along with evaluation for hiatal hernia and LES laxity that could benefit from antireflux surgery in the future -Resume PPI as currently doing -Resume antireflux lifestyle measures  7) Systemic anticoagulation 8) Portal vein thrombosis - Hold Xarelto 2 days before procedure - will instruct when and how to resume after procedure. Low  but real risk of cardiovascular event such as heart attack, stroke, embolism, thrombosis or ischemia/infarct of other organs off Xarelto explained and need to seek urgent help if this occurs. The patient consents to proceed. Will communicate by phone or EMR with Dr. Maylon Peppers to confirm that holding Xarelto is reasonable in this case  9) Religious consideration: Patient declines any blood products  10) OSA with nightly CPAP  The indications, risks, and benefits of EGD and colonoscopy were explained to the patient in detail. Risks include but are not limited to bleeding, perforation, adverse reaction to medications, and cardiopulmonary compromise. Sequelae include but are not limited to the possibility  of surgery, hositalization, and mortality. The patient verbalized understanding and wished to proceed. All questions answered, referred to scheduler and bowel prep ordered. Further recommendations pending results of the exam.    Lavena Bullion, DO, FACG  04/27/2019, 9:33 AM   Tish Men, MD

## 2019-04-27 NOTE — Patient Instructions (Addendum)
If you are age 43 or older, your body mass index should be between 23-30. Your There is no height or weight on file to calculate BMI. If this is out of the aforementioned range listed, please consider follow up with your Primary Care Provider.  If you are age 14 or younger, your body mass index should be between 19-25. Your There is no height or weight on file to calculate BMI. If this is out of the aformentioned range listed, please consider follow up with your Primary Care Provider.   To help prevent the possible spread of infection to our patients, communities, and staff; we will be implementing the following measures:  As of now we are not allowing any visitors/family members to accompany you to any upcoming appointments with [facility]. If you have any concerns about this please contact our office to discuss prior to the appointment.   You have been scheduled for an endoscopy and colonoscopy. Please follow the written instructions given to you at your visit today. Please pick up your prep supplies at the pharmacy within the next 1-3 days. If you use inhalers (even only as needed), please bring them with you on the day of your procedure. Your physician has requested that you go to www.startemmi.com and enter the access code given to you at your visit today. This web site gives a general overview about your procedure. However, you should still follow specific instructions given to you by our office regarding your preparation for the procedure.  You have been give a sample of  Clenpiq  You will be contacted by our office prior to your procedure for directions on holding your Xarelto.  If you do not hear from our office 1 week prior to your scheduled procedure, please call [phone] to discuss.   It was a pleasure to see you today!  Vito Cirigliano, D.O.

## 2019-04-27 NOTE — Telephone Encounter (Signed)
Notified patient of orders for holding Xarelto 2 days prior to procedure and resuming 1-2 days after procedure depending on biopsies. Patient verbalized understanding of orders.

## 2019-04-29 ENCOUNTER — Telehealth: Payer: Self-pay | Admitting: Gastroenterology

## 2019-04-29 NOTE — Telephone Encounter (Signed)

## 2019-04-29 NOTE — Telephone Encounter (Signed)
Patient said he has not picked up his instructions and has a procedure tomorrow @4pm . He would like to know if he can have a coffee this morning or what he can not eat

## 2019-04-29 NOTE — Telephone Encounter (Signed)
Notified the patient that he may have coffee this morning just no milk or creamer.

## 2019-04-30 ENCOUNTER — Encounter: Payer: Self-pay | Admitting: Gastroenterology

## 2019-04-30 ENCOUNTER — Ambulatory Visit (AMBULATORY_SURGERY_CENTER): Payer: BC Managed Care – PPO | Admitting: Gastroenterology

## 2019-04-30 ENCOUNTER — Other Ambulatory Visit: Payer: Self-pay

## 2019-04-30 VITALS — BP 123/79 | HR 78 | Temp 97.5°F | Resp 15 | Ht 69.0 in | Wt 296.0 lb

## 2019-04-30 DIAGNOSIS — D125 Benign neoplasm of sigmoid colon: Secondary | ICD-10-CM | POA: Diagnosis not present

## 2019-04-30 DIAGNOSIS — K921 Melena: Secondary | ICD-10-CM | POA: Diagnosis not present

## 2019-04-30 DIAGNOSIS — K2100 Gastro-esophageal reflux disease with esophagitis, without bleeding: Secondary | ICD-10-CM

## 2019-04-30 DIAGNOSIS — K317 Polyp of stomach and duodenum: Secondary | ICD-10-CM

## 2019-04-30 DIAGNOSIS — K5730 Diverticulosis of large intestine without perforation or abscess without bleeding: Secondary | ICD-10-CM | POA: Diagnosis not present

## 2019-04-30 DIAGNOSIS — D124 Benign neoplasm of descending colon: Secondary | ICD-10-CM

## 2019-04-30 DIAGNOSIS — K5732 Diverticulitis of large intestine without perforation or abscess without bleeding: Secondary | ICD-10-CM

## 2019-04-30 DIAGNOSIS — D122 Benign neoplasm of ascending colon: Secondary | ICD-10-CM | POA: Diagnosis not present

## 2019-04-30 DIAGNOSIS — K635 Polyp of colon: Secondary | ICD-10-CM

## 2019-04-30 LAB — FACTOR 5 LEIDEN

## 2019-04-30 LAB — PROTHROMBIN GENE MUTATION

## 2019-04-30 MED ORDER — SODIUM CHLORIDE 0.9 % IV SOLN: 500.0000 mL | Freq: Once | INTRAVENOUS | Status: DC

## 2019-04-30 NOTE — Progress Notes (Signed)
Report to PACU, RN, vss, BBS= Clear.  

## 2019-04-30 NOTE — Op Note (Signed)
[name] Patient Name: Marc May Procedure Date: 04/30/2019 3:27 PM MRN: [id number] Endoscopist: Gerrit Heck , MD Age: 43 Referring MD:  Date of Birth: 14-Jul-1976 Gender: Male Account #: [id number] Procedure:                Upper GI endoscopy Indications:              Heartburn, Esophageal reflux Medicines:                Monitored Anesthesia Care Procedure:                Pre-Anesthesia Assessment:                           - Prior to the procedure, a History and Physical                            was performed, and patient medications and                            allergies were reviewed. The patient's tolerance of                            previous anesthesia was also reviewed. The risks                            and benefits of the procedure and the sedation                            options and risks were discussed with the patient.                            All questions were answered, and informed consent                            was obtained. Prior Anticoagulants: The patient has                            taken Xarelto (rivaroxaban), last dose was 2 days                            prior to procedure. ASA Grade Assessment: III - A                            patient with severe systemic disease. After                            reviewing the risks and benefits, the patient was                            deemed in satisfactory condition to undergo the                            procedure.  After obtaining informed consent, the endoscope was                            passed under direct vision. Throughout the                            procedure, the patient's blood pressure, pulse, and                            oxygen saturations were monitored continuously. The                            Model [id number]-[id number]) scope was introduced                            through the mouth, and advanced to the second part                       of duodenum. The upper GI endoscopy was                            accomplished without difficulty. The patient                            tolerated the procedure well. Scope In: Scope Out: Findings:                 Mild, LA Grade A (one or more mucosal breaks less                            than 5 mm, not extending between tops of 2 mucosal                            folds) esophagitis with no bleeding was found 40 cm                            from the incisors.                           The upper third of the esophagus and middle third                            of the esophagus were normal.                           Esophagogastric landmarks were identified: the                            Z-line was found at 40 cm, the gastroesophageal                            junction was found at 40 cm and the site of hiatal  narrowing was found at 40 cm from the incisors.                           The gastroesophageal flap valve was visualized                            endoscopically and classified as Hill Grade III                            (minimal fold, loose to endoscope, hiatal hernia                            likely) with a 2.5-3 cm transverse width hernia                            noted. No axial hernia noted on anterograde views.                           Multiple small sessile polyps with no bleeding and                            no stigmata of recent bleeding were found in the                            gastric fundus and in the gastric body. Several of                            these polyps were removed with a cold biopsy                            forceps for histologic representative evaluation.                            Resection and retrieval were complete. Estimated                            blood loss was minimal.                           The incisura, gastric antrum and pylorus were                            normal.                            The duodenal bulb, first portion of the duodenum                            and second portion of the duodenum were normal. Complications:            No immediate complications. Estimated Blood Loss:     Estimated blood loss was minimal. Impression:               - LA Grade A reflux esophagitis.                           -  Normal upper third of esophagus and middle third                            of esophagus.                           - Esophagogastric landmarks identified.                           - Gastroesophageal flap valve classified as Hill                            Grade III (minimal fold, loose to endoscope, hiatal                            hernia likely).                           - Multiple gastric polyps. Resected and retrieved.                           - Normal incisura, antrum and pylorus.                           - Normal duodenal bulb, first portion of the                            duodenum and second portion of the duodenum. Recommendation:           - Patient has a contact number available for                            emergencies. The signs and symptoms of potential                            delayed complications were discussed with the                            patient. Return to normal activities tomorrow.                            Written discharge instructions were provided to the                            patient.                           - Resume previous diet.                           - Continue present medications.                           - Await pathology results.                           - Return to GI clinic at  appointment to be                            scheduled. Gerrit Heck, MD 04/30/2019 4:24:36 PM

## 2019-04-30 NOTE — Op Note (Addendum)
[place] Patient Name: Marc May Procedure Date: 04/30/2019 3:26 PM MRN: [id number] Endoscopist: Gerrit Heck , MD Age: 43 Referring MD:  Date of Birth: 04/24/1976 Gender: Male Account #: [id number] Procedure:                Colonoscopy Indications:              Abnormal CT of the GI tract, , Follow-up of                            diverticulitis, Constipation                           43 yo male with recurrent diverticulitis, with at                            least 5 episodes requiring antibiotics over the                            last 5 years. Most recent episode was in 11/2018,                            complicated by right PV thrombosis. CT earlier this                            month demonstrated fpossible filling defect in the                            sigmoid colon. Additionally, has a history of                            constipation for the last 5 years. Medicines:                Monitored Anesthesia Care Procedure:                Pre-Anesthesia Assessment:                           - Prior to the procedure, a History and Physical                            was performed, and patient medications and                            allergies were reviewed. The patient's tolerance of                            previous anesthesia was also reviewed. The risks                            and benefits of the procedure and the sedation                            options and risks were discussed with the patient.  All questions were answered, and informed consent                            was obtained. Prior Anticoagulants: The patient has                            taken Xarelto (rivaroxaban), last dose was 2 days                            prior to procedure. ASA Grade Assessment: III - A                            patient with severe systemic disease. After                            reviewing the risks and benefits, the patient  was                            deemed in satisfactory condition to undergo the                            procedure.                           After obtaining informed consent, the colonoscope                            was passed under direct vision. Throughout the                            procedure, the patient's blood pressure, pulse, and                            oxygen saturations were monitored continuously. The                            Colonoscope was introduced through the anus and                            advanced to the the terminal ileum. The colonoscopy                            was performed without difficulty. The patient                            tolerated the procedure well. The quality of the                            bowel preparation was fair. Scope In: 3:50:12 PM Scope Out: 4:16:36 PM Scope Withdrawal Time: 0 hours 23 minutes 45 seconds  Total Procedure Duration: 0 hours 26 minutes 24 seconds  Findings:                 The perianal and digital rectal examinations were  normal.                           Three sessile polyps were found in the descending                            colon and ascending colon. The polyps were 3 to 4                            mm in size. These polyps were removed with a cold                            snare. Resection and retrieval were complete.                            Estimated blood loss was minimal.                           Two sessile polyps were found in the sigmoid colon.                            The polyps were 2 to 3 mm in size. These polyps                            were removed with a cold biopsy forceps. Resection                            and retrieval were complete. Estimated blood loss                            was minimal.                           Multiple small and large-mouthed diverticula were                            found in the sigmoid colon.                           An  area of edematous, mildly erythematous mucosa                            and luminal narrowng was found in the sigmoid                            colon. This was approximately 8 cm in length and                            located in an area of diverticulosis. This was                            biopsied with a cold forceps for histology.  Estimated blood loss was minimal.                           The retroflexed view of the distal rectum and anal                            verge was normal and showed no anal or rectal                            abnormalities.                           The terminal ileum appeared normal. Complications:            No immediate complications. Estimated Blood Loss:     Estimated blood loss was minimal. Impression:               - Preparation of the colon was fair.                           - Three 3 to 4 mm polyps in the descending colon                            and in the ascending colon, removed with a cold                            snare. Resected and retrieved.                           - Two 2 to 3 mm polyps in the sigmoid colon,                            removed with a cold biopsy forceps. Resected and                            retrieved.                           - Diverticulosis in the sigmoid colon.                           - Congested mucosa in the sigmoid colon. Biopsied.                           - The distal rectum and anal verge are normal on                            retroflexion view.                           - The examined portion of the ileum was normal. Recommendation:           - Patient has a contact number available for  emergencies. The signs and symptoms of potential                            delayed complications were discussed with the                            patient. Return to normal activities tomorrow.                            Written discharge instructions were  provided to the                            patient.                           - Resume previous diet.                           - Continue present medications.                           - Await pathology results.                           - Repeat colonoscopy in 2 years because the bowel                            preparation was suboptimal and for surveillance.                           - Return to GI clinic at appointment to be                            scheduled.                           - Ok to resume Xarelto. Gerrit Heck, MD 04/30/2019 4:34:08 PM

## 2019-04-30 NOTE — Patient Instructions (Signed)
Resume your xarelto today.  Recall x 2 years per Dr. Loletha Grayer.  YOU HAD AN ENDOSCOPIC PROCEDURE TODAY AT THE [facility]:   Refer to the procedure report that was given to you for any specific questions about what was found during the examination.  If the procedure report does not answer your questions, please call your gastroenterologist to clarify.  If you requested that your care partner not be given the details of your procedure findings, then the procedure report has been included in a sealed envelope for you to review at your convenience later.  YOU SHOULD EXPECT: Some feelings of bloating in the abdomen. Passage of more gas than usual.  Walking can help get rid of the air that was put into your GI tract during the procedure and reduce the bloating. If you had a lower endoscopy (such as a colonoscopy or flexible sigmoidoscopy) you may notice spotting of blood in your stool or on the toilet paper. If you underwent a bowel prep for your procedure, you may not have a normal bowel movement for a few days.  Please Note:  You might notice some irritation and congestion in your nose or some drainage.  This is from the oxygen used during your procedure.  There is no need for concern and it should clear up in a day or so.  SYMPTOMS TO REPORT IMMEDIATELY:   Following lower endoscopy (colonoscopy or flexible sigmoidoscopy):  Excessive amounts of blood in the stool  Significant tenderness or worsening of abdominal pains  Swelling of the abdomen that is new, acute  Fever of 100F or higher   Following upper endoscopy (EGD)  Vomiting of blood or coffee ground material  New chest pain or pain under the shoulder blades  Painful or persistently difficult swallowing  New shortness of breath  Fever of 100F or higher  Black, tarry-looking stools  For urgent or emergent issues, a gastroenterologist can be reached at any hour by calling [phone].   DIET:  We do recommend a small meal at  first, but then you may proceed to your regular diet.  Drink plenty of fluids but you should avoid alcoholic beverages for 24 hours.  ACTIVITY:  You should plan to take it easy for the rest of today and you should NOT DRIVE or use heavy machinery until tomorrow (because of the sedation medicines used during the test).    FOLLOW UP: Our staff will call the number listed on your records 48-72 hours following your procedure to check on you and address any questions or concerns that you may have regarding the information given to you following your procedure. If we do not reach you, we will leave a message.  We will attempt to reach you two times.  During this call, we will ask if you have developed any symptoms of COVID 19. If you develop any symptoms (ie: fever, flu-like symptoms, shortness of breath, cough etc.) before then, please call [phone].  If you test positive for Covid 19 in the 2 weeks post procedure, please call and report this information to [place].    If any biopsies were taken you will be contacted by phone or by letter within the next 1-3 weeks.  Please call us at [phone] if you have not heard about the biopsies in 3 weeks.    SIGNATURES/CONFIDENTIALITY: You and/or your care partner have signed paperwork which will be entered into your electronic medical record.  These signatures attest to the fact that that  the information above on your After Visit Summary has been reviewed and is understood.  Full responsibility of the confidentiality of this discharge information lies with you and/or your care-partner.

## 2019-04-30 NOTE — Progress Notes (Signed)
Called to room to assist during endoscopic procedure.  Patient ID and intended procedure confirmed with present staff. Received instructions for my participation in the procedure from the performing physician.  

## 2019-05-01 ENCOUNTER — Telehealth: Payer: Self-pay | Admitting: *Deleted

## 2019-05-01 ENCOUNTER — Ambulatory Visit: Payer: BC Managed Care – PPO | Admitting: Gastroenterology

## 2019-05-01 NOTE — Telephone Encounter (Signed)
-----   Message from Tish Men, MD sent at 04/30/2019  5:58 PM EDT ----- Delrae Sawyers,  Can we let Mr. Levingston know that additional genetic screening tests for increased risk of clotting was negative? Thanks.  Larimer  ----- Message ----- From: Buel Ream, [facility] Sent: 04/24/2019   1:44 PM EDT To: Tish Men, MD

## 2019-05-01 NOTE — Telephone Encounter (Signed)
As noted below by Dr. Maylon Peppers, I informed the patient that the additional genetic screening test for increased risk of clotting was negative. He verbalized understanding.

## 2019-05-04 ENCOUNTER — Telehealth: Payer: Self-pay | Admitting: *Deleted

## 2019-05-04 NOTE — Telephone Encounter (Signed)
  Follow up Call-  Call back number 04/30/2019  Post procedure Call Back phone  # [phone]  Permission to leave phone message Yes     Patient questions:  Do you have a fever, pain , or abdominal swelling? No. Pain Score  0 *  Have you tolerated food without any problems? Yes.    Have you been able to return to your normal activities? Yes.    Do you have any questions about your discharge instructions: Diet   No. Medications  No. Follow up visit  No.  Do you have questions or concerns about your Care? No.  Actions: * If pain score is 4 or above: No action needed, pain <4.  1. Have you developed a fever since your procedure? no  2.   Have you had an respiratory symptoms (SOB or cough) since your procedure? no  3.   Have you tested positive for COVID 19 since your procedure no  4.   Have you had any family members/close contacts diagnosed with the COVID 19 since your procedure?  no   If yes to any of these questions please route to Joylene John, RN and Alphonsa Gin, Therapist, sports.

## 2019-05-06 ENCOUNTER — Encounter: Payer: Self-pay | Admitting: Gastroenterology

## 2019-06-17 ENCOUNTER — Ambulatory Visit (INDEPENDENT_AMBULATORY_CARE_PROVIDER_SITE_OTHER): Payer: BC Managed Care – PPO | Admitting: Physician Assistant

## 2019-06-17 ENCOUNTER — Encounter: Payer: Self-pay | Admitting: Physician Assistant

## 2019-06-17 DIAGNOSIS — M1711 Unilateral primary osteoarthritis, right knee: Secondary | ICD-10-CM

## 2019-06-17 DIAGNOSIS — M1712 Unilateral primary osteoarthritis, left knee: Secondary | ICD-10-CM

## 2019-06-17 MED ORDER — METHYLPREDNISOLONE ACETATE 40 MG/ML IJ SUSP
40.0000 mg | INTRAMUSCULAR | Status: AC | PRN
  Administered 2019-06-17: 40 mg via INTRA_ARTICULAR

## 2019-06-17 MED ORDER — LIDOCAINE HCL 1 % IJ SOLN
5.0000 mL | INTRAMUSCULAR | Status: AC | PRN
  Administered 2019-06-17: 5 mL

## 2019-06-17 MED ORDER — LIDOCAINE HCL 1 % IJ SOLN
0.5000 mL | INTRAMUSCULAR | Status: AC | PRN
  Administered 2019-06-17: .5 mL

## 2019-06-17 NOTE — Progress Notes (Signed)
   Procedure Note  Patient: Marc May             Date of Birth: 1976-11-07           MRN: [id number]             Visit Date: 06/17/2019 HPI: [name] returns today requesting cortisone injections both knees.  He has been treated for a portal vein thrombosis and is on blood thinners.  He has severe pain in both knees.  He last had injections both knees on 02/03/2019.  He has had no new injury to either knee.  Physical exam: Bilateral knees right knee with slight effusion no abnormal warmth erythema left knee no effusion no abnormal warmth no erythema.  No rashes skin lesions ulcerations erythema either knee.  Good range of motion of bilateral knees.  Procedures: Visit Diagnoses:  1. Unilateral primary osteoarthritis, left knee   2. Unilateral primary osteoarthritis, right knee     Large Joint Inj: bilateral knee on 06/17/2019 3:30 PM Indications: pain Details: 22 G 1.5 in needle, anterolateral approach  Arthrogram: No  Medications (Right): 5 mL lidocaine 1 %; 40 mg methylPREDNISolone acetate 40 MG/ML Aspirate (Right): 13 mL yellow and blood-tinged Medications (Left): 0.5 mL lidocaine 1 %; 40 mg methylPREDNISolone acetate 40 MG/ML Outcome: tolerated well, no immediate complications Procedure, treatment alternatives, risks and benefits explained, specific risks discussed. Consent was given by the patient. Immediately prior to procedure a time out was called to verify the correct patient, procedure, equipment, support staff and site/side marked as required. Patient was prepped and draped in the usual sterile fashion.     Plan: He understands that he can have injections no more often than every 3 months.  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  He does have some swelling both legs and recommend compression stockings.

## 2019-07-17 ENCOUNTER — Encounter (HOSPITAL_BASED_OUTPATIENT_CLINIC_OR_DEPARTMENT_OTHER): Payer: Self-pay

## 2019-07-17 ENCOUNTER — Other Ambulatory Visit: Payer: Self-pay

## 2019-07-17 ENCOUNTER — Ambulatory Visit (HOSPITAL_BASED_OUTPATIENT_CLINIC_OR_DEPARTMENT_OTHER)
Admission: RE | Admit: 2019-07-17 | Discharge: 2019-07-17 | Disposition: A | Discharge status: Home / Self Care | Payer: BC Managed Care – PPO | Source: Ambulatory Visit | Attending: Hematology | Admitting: Hematology

## 2019-07-17 DIAGNOSIS — I81 Portal vein thrombosis: Secondary | ICD-10-CM | POA: Insufficient documentation

## 2019-07-17 DIAGNOSIS — K5730 Diverticulosis of large intestine without perforation or abscess without bleeding: Secondary | ICD-10-CM | POA: Diagnosis not present

## 2019-07-17 MED ORDER — IOHEXOL 300 MG/ML  SOLN
100.0000 mL | Freq: Once | INTRAMUSCULAR | Status: AC | PRN
  Administered 2019-07-17: 12:00:00 100 mL via INTRAVENOUS

## 2019-07-24 ENCOUNTER — Other Ambulatory Visit: Payer: Self-pay

## 2019-07-24 ENCOUNTER — Encounter: Payer: Self-pay | Admitting: Hematology

## 2019-07-24 ENCOUNTER — Inpatient Hospital Stay (HOSPITAL_BASED_OUTPATIENT_CLINIC_OR_DEPARTMENT_OTHER): Payer: BC Managed Care – PPO | Admitting: Hematology

## 2019-07-24 ENCOUNTER — Inpatient Hospital Stay: Payer: Self-pay | Attending: Hematology

## 2019-07-24 VITALS — BP 128/75 | HR 86 | Temp 99.0°F | Resp 18 | Wt 301.0 lb

## 2019-07-24 DIAGNOSIS — M170 Bilateral primary osteoarthritis of knee: Secondary | ICD-10-CM | POA: Diagnosis not present

## 2019-07-24 DIAGNOSIS — I81 Portal vein thrombosis: Secondary | ICD-10-CM

## 2019-07-24 DIAGNOSIS — Z7901 Long term (current) use of anticoagulants: Secondary | ICD-10-CM | POA: Insufficient documentation

## 2019-07-24 LAB — CBC WITH DIFFERENTIAL (CANCER CENTER ONLY)
Abs Immature Granulocytes: 0.04 10*3/uL (ref 0.00–0.07)
Basophils Absolute: 0.1 10*3/uL (ref 0.0–0.1)
Basophils Relative: 1 %
Eosinophils Absolute: 0.1 10*3/uL (ref 0.0–0.5)
Eosinophils Relative: 2 %
HCT: 47.4 % (ref 39.0–52.0)
Hemoglobin: 15.2 g/dL (ref 13.0–17.0)
Immature Granulocytes: 1 %
Lymphocytes Relative: 26 %
Lymphs Abs: 1.8 10*3/uL (ref 0.7–4.0)
MCH: 27 pg (ref 26.0–34.0)
MCHC: 32.1 g/dL (ref 30.0–36.0)
MCV: 84 fL (ref 80.0–100.0)
Monocytes Absolute: 0.6 10*3/uL (ref 0.1–1.0)
Monocytes Relative: 9 %
Neutro Abs: 4.2 10*3/uL (ref 1.7–7.7)
Neutrophils Relative %: 61 %
Platelet Count: 312 10*3/uL (ref 150–400)
RBC: 5.64 MIL/uL (ref 4.22–5.81)
RDW: 13.4 % (ref 11.5–15.5)
WBC Count: 6.8 10*3/uL (ref 4.0–10.5)
nRBC: 0 % (ref 0.0–0.2)

## 2019-07-24 LAB — CMP (CANCER CENTER ONLY)
ALT: 39 U/L (ref 0–44)
AST: 22 U/L (ref 15–41)
Albumin: 4.3 g/dL (ref 3.5–5.0)
Alkaline Phosphatase: 84 U/L (ref 38–126)
Anion gap: 9 (ref 5–15)
BUN: 14 mg/dL (ref 6–20)
CO2: 26 mmol/L (ref 22–32)
Calcium: 9.5 mg/dL (ref 8.9–10.3)
Chloride: 103 mmol/L (ref 98–111)
Creatinine: 1 mg/dL (ref 0.61–1.24)
GFR, Est AFR Am: >60 mL/min (ref >60)
GFR, Estimated: >60 mL/min (ref >60)
Glucose, Bld: 109 mg/dL — ABNORMAL HIGH (ref 70–99)
Potassium: 4 mmol/L (ref 3.5–5.1)
Sodium: 138 mmol/L (ref 135–145)
Total Bilirubin: 0.5 mg/dL (ref 0.3–1.2)
Total Protein: 7.7 g/dL (ref 6.5–8.1)

## 2019-07-24 LAB — D-DIMER, QUANTITATIVE: D-Dimer, Quant: 0.48 ug/mL-FEU (ref 0.00–0.50)

## 2019-07-24 NOTE — Progress Notes (Signed)
[facility] May OFFICE PROGRESS NOTE  Patient Care Team: Isaac Bliss, Rayford Halsted, MD as PCP - General (Internal Medicine)  HEME/ONC OVERVIEW: 1. R portal vein thrombosis, most likely due to acute intra-abdominal inflammation -Late 12/2018: CT AP (for abdominal pain) showed acute diverticulitis and an incidental partial R portal vein thrombosis; treated with Lovenox and discharged home w/ Xarelto  Hypercoagulable work-up, including APLS, PT mutation, Factor V Leiden, MPN and PNH, was all negative   04/2019: persistent R portal vein thrombosis, albeit less apparent, on CT  07/2019: no residual right portal vein thrombosis on CT  TREATMENT REGIMEN:  01/12/2019 - present: Xarelto 20mg  daily  ASSESSMENT & PLAN:   R portal vein thrombosis -Most likely due to intra-abdominal inflammation in the setting of acute diverticulitis -Hypercoagulable work-up, including APLS, PT mutation, Factor V Leiden, MPN and PNH, was all negative  -Patient has been on Xarelto since 01/2019 and completed at least 6 months of anticoagulation -I independently reviewed the radiologic images of recent CT abdomen/pelvis, and agree with findings documented; in summary, CT showed resolution of the right portal vein thrombosis.  There was sigmoid colon diverticulosis w/ wall thickening and small adjacent LN's in the mesentery, likely reactive.  Patient had colonoscopy in 04/2019 that removed several polyps and showed some congested mucosa in the sigmoid colon, bx benign. -As the portal vein thrombosis has resolved and there is no indication for indefinite anticoagulation, I would recommend stopping anticoagulation at this time and monitor for any recurrent symptoms, such as abdominal pain -We will also plan to repeat CT abdomen/pelvis in 3 months after discontinuing anticoagulation to monitor for any changes   Chronic osteoarthritis of the knees -Patient reports chronic osteoarthritis of bilateral knees, for  which he is waiting to be scheduled for bilateral knee replacement -I counseled the patient on the importance of prophylactic anticoagulation and early mobility after the surgery  Orders Placed This Encounter  Procedures  . CT ABDOMEN PELVIS W CONTRAST    Standing Status:   Future    Standing Expiration Date:   07/23/2020    Order Specific Question:   ** REASON FOR EXAM (FREE TEXT)    Answer:   Monitor portal vein thrombosis    Order Specific Question:   If indicated for the ordered procedure, I authorize the administration of contrast media per Radiology protocol    Answer:   Yes    Order Specific Question:   Preferred imaging location?    Answer:   [facility] Specific Question:   Is Oral Contrast requested for this exam?    Answer:   Yes, Per Radiology protocol    Order Specific Question:   Radiology Contrast Protocol - do NOT remove file path    Answer:   [email or web address]  . CBC with Differential ([place] Only)    Standing Status:   Future    Standing Expiration Date:   08/27/2020  . CMP ([place] only)    Standing Status:   Future    Standing Expiration Date:   08/27/2020   All questions were answered. The patient knows to call the clinic with any problems, questions or concerns. No barriers to learning was detected.  A total of more than 25 minutes were spent face-to-face with the patient during this encounter and over half of that time was spent on counseling and coordination of care as outlined above.   Return in 3 months for labs, imaging results and clinic appt.  Tish Men, MD 07/24/2019 2:31 PM  CHIEF COMPLAINT: "My knees are still bad"  INTERVAL HISTORY: Marc May returns to clinic for follow-up for portal veinous thrombosis on Xarelto.  Patient reports that he is compliant with Xarelto, and has not had any abnormal bleeding or excess bruising.  He has severe osteoarthritis in bilateral knees, which  significantly limits his mobility, but he has not been taking any Tylenol or ibuprofen due to concern about bleeding while on Xarelto.  He has been trying to lose some weight with exercise and diet in anticipation of the knee surgery.  He denies any other complaint today.  REVIEW OF SYSTEMS:   Constitutional: ( - ) fevers, ( - )  chills , ( - ) night sweats Eyes: ( - ) blurriness of vision, ( - ) double vision, ( - ) watery eyes Ears, nose, mouth, throat, and face: ( - ) mucositis, ( - ) sore throat Respiratory: ( - ) cough, ( - ) dyspnea, ( - ) wheezes Cardiovascular: ( - ) palpitation, ( - ) chest discomfort, ( - ) lower extremity swelling Gastrointestinal:  ( - ) nausea, ( - ) heartburn, ( - ) change in bowel habits Skin: ( - ) abnormal skin rashes Lymphatics: ( - ) new lymphadenopathy, ( - ) easy bruising Neurological: ( - ) numbness, ( - ) tingling, ( - ) new weaknesses Behavioral/Psych: ( - ) mood change, ( - ) new changes  All other systems were reviewed with the patient and are negative.  I have reviewed the past medical history, past surgical history, social history and family history with the patient and they are unchanged from previous note.  ALLERGIES:  is allergic to banana; bee venom; and other.  MEDICATIONS:  Current Outpatient Medications  Medication Sig Dispense Refill  . diclofenac sodium (VOLTAREN) 1 % GEL Apply 2 g topically 4 (four) times daily. 100 g 3  . EPINEPHrine 0.3 mg/0.3 mL IJ SOAJ injection Inject 0.3 mLs (0.3 mg total) into the muscle as needed for anaphylaxis (bee stings). 2 Device 0   No current facility-administered medications for this visit.     PHYSICAL EXAMINATION: ECOG PERFORMANCE STATUS: 1 - Symptomatic but completely ambulatory  Today's Vitals   07/24/19 1405 07/24/19 1406  BP: 128/75   Pulse: 86   Resp: 18   Temp: 99 F (37.2 C)   TempSrc: Oral   SpO2: 96%   Weight: (!) 301 lb (136.5 kg)   PainSc:  0-No pain   Body mass index is  44.45 kg/m.  Filed Weights   07/24/19 1405  Weight: (!) 301 lb (136.5 kg)    GENERAL: alert, no distress and comfortable SKIN: skin color, texture, turgor are normal, no rashes or significant lesions EYES: conjunctiva are pink and non-injected, sclera clear OROPHARYNX: no exudate, no erythema; lips, buccal mucosa, and tongue normal  NECK: supple, non-tender LUNGS: clear to auscultation with normal breathing effort HEART: regular rate & rhythm and no murmurs and no lower extremity edema ABDOMEN: soft, non-tender, non-distended, normal bowel sounds Musculoskeletal: slow gate due to bilateral knee arthritis  PSYCH: alert & oriented x 3, fluent speech NEURO: no focal motor/sensory deficits  LABORATORY DATA:  I have reviewed the data as listed    Component Value Date/Time   NA 138 07/24/2019 1349   K 4.0 07/24/2019 1349   CL 103 07/24/2019 1349   CO2 26 07/24/2019 1349   GLUCOSE 109 (H) 07/24/2019 1349   BUN 14 07/24/2019 1349  CREATININE 1.00 07/24/2019 1349   CALCIUM 9.5 07/24/2019 1349   PROT 7.7 07/24/2019 1349   ALBUMIN 4.3 07/24/2019 1349   AST 22 07/24/2019 1349   ALT 39 07/24/2019 1349   ALKPHOS 84 07/24/2019 1349   BILITOT 0.5 07/24/2019 1349   GFRNONAA >60 07/24/2019 1349   GFRAA >60 07/24/2019 1349    No results found for: SPEP, UPEP  Lab Results  Component Value Date   WBC 6.8 07/24/2019   NEUTROABS 4.2 07/24/2019   HGB 15.2 07/24/2019   HCT 47.4 07/24/2019   MCV 84.0 07/24/2019   PLT 312 07/24/2019      Chemistry      Component Value Date/Time   NA 138 07/24/2019 1349   K 4.0 07/24/2019 1349   CL 103 07/24/2019 1349   CO2 26 07/24/2019 1349   BUN 14 07/24/2019 1349   CREATININE 1.00 07/24/2019 1349      Component Value Date/Time   CALCIUM 9.5 07/24/2019 1349   ALKPHOS 84 07/24/2019 1349   AST 22 07/24/2019 1349   ALT 39 07/24/2019 1349   BILITOT 0.5 07/24/2019 1349       RADIOGRAPHIC STUDIES: I have personally reviewed the  radiological images as listed below and agreed with the findings in the report. Ct Abdomen Pelvis W Contrast  Result Date: 07/17/2019 CLINICAL DATA:  Portal vein thrombosis. EXAM: CT ABDOMEN AND PELVIS WITH CONTRAST TECHNIQUE: Multidetector CT imaging of the abdomen and pelvis was performed using the standard protocol following bolus administration of intravenous contrast. CONTRAST:  150mL OMNIPAQUE IOHEXOL 300 MG/ML  SOLN COMPARISON:  Multiple exams, including 04/23/2019 FINDINGS: Lower chest: Unremarkable Hepatobiliary: Although the right portal vein remains somewhat smaller in caliber than the left, the previous branching hypodensities in the right hepatic lobe shown on the CT of 04/23/2019 and the previous sharply defined portal vein thrombus on the MRI from 01/09/2019 no longer readily apparent. No discrete underlying liver lesion is observed. Gallbladder unremarkable. No biliary dilatation. Pancreas: Unremarkable Spleen: Unremarkable Adrenals/Urinary Tract: Unremarkable Stomach/Bowel: Small periampullary duodenal diverticulum. Sigmoid colon diverticulosis with wall thickening, small adjacent lymph nodes, and mild adjacent stranding. This could be from low-grade diverticulitis, as noted on the prior exam, it is difficult to exclude the possibility of sigmoid colon malignancy in this setting. An adjacent lymph node in the sigmoid mesentery measures 0.7 cm in short axis on image 61/5. Vascular/Lymphatic: Porta hepatis node 0.9 cm in short axis on image 27/2, upper normal size. Reproductive: Unremarkable Other: Stable stranding in the omentum above the level of the umbilicus image [id number], oval-shaped, likely from remote small omental infarct/fat necrosis, unchanged. No overt nodularity along the omentum to suggest omental tumor deposition. Musculoskeletal: Mild lumbar spondylosis and degenerative disc disease most notable at L4-5 and L5-S1. IMPRESSION: 1. No appreciable visible residual right portal vein  thrombosis. The right portal vein remains slightly smaller than the caliber of the left portal vein. 2. Sigmoid colon diverticulosis with wall thickening, adjacent small lymph nodes along the sigmoid mesentery, and some mild adjacent stranding. While these findings could all be explained by diverticulosis with mild diverticulitis, strictly speaking an underlying sigmoid colon mass cannot be readily excluded based on today's exam. 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 Electronically Signed   By: Van Clines M.D.   On: 07/17/2019 12:50

## 2019-07-27 ENCOUNTER — Telehealth: Payer: Self-pay | Admitting: Hematology

## 2019-07-27 NOTE — Telephone Encounter (Signed)
Called and spoke with patient regarding appointments scheduled per 9/11 los

## 2019-07-29 ENCOUNTER — Encounter: Payer: Self-pay | Admitting: Orthopaedic Surgery

## 2019-07-29 ENCOUNTER — Ambulatory Visit (INDEPENDENT_AMBULATORY_CARE_PROVIDER_SITE_OTHER): Payer: BC Managed Care – PPO | Admitting: Orthopaedic Surgery

## 2019-07-29 DIAGNOSIS — M1711 Unilateral primary osteoarthritis, right knee: Secondary | ICD-10-CM | POA: Diagnosis not present

## 2019-07-29 DIAGNOSIS — M1712 Unilateral primary osteoarthritis, left knee: Secondary | ICD-10-CM

## 2019-07-29 NOTE — Progress Notes (Signed)
The patient is similar seeing in follow-up.  He actually has known severe end-stage arthritis of both his knees.  We have seen him for this before.  He is tried and failed all forms of conservative treatment.  He is been dealing with his knees for over 3 years now.  He had to hold off on surgery before due to being on Xarelto.  He is now off all blood thinners and is gotten to go ahead for surgery from his primary care physicians.  He did have portal hypertension that is resolved as well.  He does have a BMI of 44.45.  He is 300 pounds and 5 foot 9 inches tall.  His bilateral knee pain is daily and is severe.  His x-rays do confirm severe end-stage arthritis of both knees.  There is significant varus malalignment.  There is complete loss the medial joint space bilaterally.  There are osteophytes in all 3 compartments.  He does have end-stage arthritis of both knees knees.  They both move well but are very painful.  Again he is tried and failed all forms of conservative treatment to the point that his bilateral knee pain is definitely affecting his mobility, his quality of life, and his actives daily living.  He does wish for [place] to proceed with bilateral total knee arthroplasty surgery but I told him with my practice at this point I am only comfortable with performing one knee replacement at a time and not both at once based on my own practices history of complications as it relates to DVTs and other issues.  Obviously with his weight and more importantly deformity of his knees I do feel his knee replacements even with one at a time again to be difficult.  Given his young age I would want to concentrate on having his knee replacement is as perfect as possible for alignment purposes as well as that he get the most out of his knee replacement.  Were going to proceed with the right first and then as he recovers in the right can consider the left.  He is Jehov[employer]'s Witness so no blood products will be used but I gave  him reassurance that this is usually not the case with knee replacement surgery and he does have a really normal hemoglobin hematocrit.  We talked about the intraoperative and postoperative course and what surgery involves.  We will work on getting this scheduled in the near future.  Again I empathize with his disappointment of [place] not doing both knees at once but I just am not comfortable at this point doing bilateral knee replacements and I did convey this to him and he appreciated my honesty.  We will work on getting the surgery scheduled and we will see him back in 2 weeks postoperative.  All question concerns were answered and addressed.

## 2019-07-31 ENCOUNTER — Ambulatory Visit: Payer: BC Managed Care – PPO | Admitting: Hematology

## 2019-07-31 ENCOUNTER — Other Ambulatory Visit: Payer: BC Managed Care – PPO

## 2019-08-03 DIAGNOSIS — M25562 Pain in left knee: Secondary | ICD-10-CM | POA: Diagnosis not present

## 2019-08-03 DIAGNOSIS — M659 Synovitis and tenosynovitis, unspecified: Secondary | ICD-10-CM | POA: Diagnosis not present

## 2019-08-03 DIAGNOSIS — M170 Bilateral primary osteoarthritis of knee: Secondary | ICD-10-CM | POA: Diagnosis not present

## 2019-08-03 DIAGNOSIS — M25561 Pain in right knee: Secondary | ICD-10-CM | POA: Diagnosis not present

## 2019-09-14 DIAGNOSIS — M9901 Segmental and somatic dysfunction of cervical region: Secondary | ICD-10-CM | POA: Diagnosis not present

## 2019-09-14 DIAGNOSIS — M9903 Segmental and somatic dysfunction of lumbar region: Secondary | ICD-10-CM | POA: Diagnosis not present

## 2019-09-14 DIAGNOSIS — M5413 Radiculopathy, cervicothoracic region: Secondary | ICD-10-CM | POA: Diagnosis not present

## 2019-09-14 DIAGNOSIS — M5417 Radiculopathy, lumbosacral region: Secondary | ICD-10-CM | POA: Diagnosis not present

## 2019-09-23 DIAGNOSIS — M25562 Pain in left knee: Secondary | ICD-10-CM | POA: Diagnosis not present

## 2019-09-23 DIAGNOSIS — M25561 Pain in right knee: Secondary | ICD-10-CM | POA: Diagnosis not present

## 2019-09-25 DIAGNOSIS — M25562 Pain in left knee: Secondary | ICD-10-CM | POA: Diagnosis not present

## 2019-09-25 DIAGNOSIS — M25561 Pain in right knee: Secondary | ICD-10-CM | POA: Diagnosis not present

## 2019-09-30 DIAGNOSIS — M25562 Pain in left knee: Secondary | ICD-10-CM | POA: Diagnosis not present

## 2019-09-30 DIAGNOSIS — M25561 Pain in right knee: Secondary | ICD-10-CM | POA: Diagnosis not present

## 2019-10-01 ENCOUNTER — Telehealth: Payer: Self-pay | Admitting: Orthopaedic Surgery

## 2019-10-01 ENCOUNTER — Telehealth: Payer: Self-pay | Admitting: Hematology

## 2019-10-01 NOTE — Telephone Encounter (Signed)
Pt called in requesting a prescription for custom shoe inserts.  Please give pt a call [phone]

## 2019-10-01 NOTE — Telephone Encounter (Signed)
Dr Maylon Peppers December appts rescheduled due to his PAL day.  Letter/Calendar mailed

## 2019-10-01 NOTE — Telephone Encounter (Signed)
Please advise 

## 2019-10-01 NOTE — Telephone Encounter (Signed)
See scripr

## 2019-10-02 DIAGNOSIS — M25562 Pain in left knee: Secondary | ICD-10-CM | POA: Diagnosis not present

## 2019-10-02 DIAGNOSIS — M25561 Pain in right knee: Secondary | ICD-10-CM | POA: Diagnosis not present

## 2019-10-02 NOTE — Telephone Encounter (Signed)
LMOM for patient that I was sending that Rx to [facility]

## 2019-10-05 DIAGNOSIS — M25562 Pain in left knee: Secondary | ICD-10-CM | POA: Diagnosis not present

## 2019-10-05 DIAGNOSIS — M25561 Pain in right knee: Secondary | ICD-10-CM | POA: Diagnosis not present

## 2019-10-12 DIAGNOSIS — M25561 Pain in right knee: Secondary | ICD-10-CM | POA: Diagnosis not present

## 2019-10-12 DIAGNOSIS — M25562 Pain in left knee: Secondary | ICD-10-CM | POA: Diagnosis not present

## 2019-10-23 DIAGNOSIS — M79672 Pain in left foot: Secondary | ICD-10-CM | POA: Diagnosis not present

## 2019-10-23 DIAGNOSIS — M79671 Pain in right foot: Secondary | ICD-10-CM | POA: Diagnosis not present

## 2019-10-26 ENCOUNTER — Ambulatory Visit (HOSPITAL_BASED_OUTPATIENT_CLINIC_OR_DEPARTMENT_OTHER)
Admission: RE | Admit: 2019-10-26 | Discharge: 2019-10-26 | Disposition: A | Discharge status: Home / Self Care | Payer: BC Managed Care – PPO | Source: Ambulatory Visit | Attending: Hematology | Admitting: Hematology

## 2019-10-26 ENCOUNTER — Other Ambulatory Visit: Payer: Self-pay

## 2019-10-26 ENCOUNTER — Encounter (HOSPITAL_BASED_OUTPATIENT_CLINIC_OR_DEPARTMENT_OTHER): Payer: Self-pay

## 2019-10-26 DIAGNOSIS — K7460 Unspecified cirrhosis of liver: Secondary | ICD-10-CM | POA: Diagnosis not present

## 2019-10-26 DIAGNOSIS — I81 Portal vein thrombosis: Secondary | ICD-10-CM | POA: Insufficient documentation

## 2019-10-26 MED ORDER — IOHEXOL 300 MG/ML  SOLN
100.0000 mL | Freq: Once | INTRAMUSCULAR | Status: AC | PRN
  Administered 2019-10-26: 100 mL via INTRAVENOUS

## 2019-10-29 ENCOUNTER — Encounter: Payer: Self-pay | Admitting: Hematology

## 2019-10-29 ENCOUNTER — Inpatient Hospital Stay (HOSPITAL_BASED_OUTPATIENT_CLINIC_OR_DEPARTMENT_OTHER): Payer: BC Managed Care – PPO | Admitting: Hematology

## 2019-10-29 ENCOUNTER — Inpatient Hospital Stay: Payer: BC Managed Care – PPO | Attending: Hematology & Oncology

## 2019-10-29 ENCOUNTER — Telehealth: Payer: Self-pay | Admitting: Hematology

## 2019-10-29 ENCOUNTER — Other Ambulatory Visit: Payer: Self-pay

## 2019-10-29 VITALS — BP 138/87 | HR 73 | Temp 97.9°F | Resp 19 | Ht 69.0 in | Wt 316.0 lb

## 2019-10-29 DIAGNOSIS — I81 Portal vein thrombosis: Secondary | ICD-10-CM

## 2019-10-29 DIAGNOSIS — Z79899 Other long term (current) drug therapy: Secondary | ICD-10-CM | POA: Diagnosis not present

## 2019-10-29 LAB — CBC WITH DIFFERENTIAL (CANCER CENTER ONLY)
Abs Immature Granulocytes: 0.08 10*3/uL — ABNORMAL HIGH (ref 0.00–0.07)
Basophils Absolute: 0 10*3/uL (ref 0.0–0.1)
Basophils Relative: 1 %
Eosinophils Absolute: 0.2 10*3/uL (ref 0.0–0.5)
Eosinophils Relative: 2 %
HCT: 45.5 % (ref 39.0–52.0)
Hemoglobin: 14.4 g/dL (ref 13.0–17.0)
Immature Granulocytes: 1 %
Lymphocytes Relative: 24 %
Lymphs Abs: 1.7 10*3/uL (ref 0.7–4.0)
MCH: 26.4 pg (ref 26.0–34.0)
MCHC: 31.6 g/dL (ref 30.0–36.0)
MCV: 83.5 fL (ref 80.0–100.0)
Monocytes Absolute: 0.8 10*3/uL (ref 0.1–1.0)
Monocytes Relative: 11 %
Neutro Abs: 4.4 10*3/uL (ref 1.7–7.7)
Neutrophils Relative %: 61 %
Platelet Count: 294 10*3/uL (ref 150–400)
RBC: 5.45 MIL/uL (ref 4.22–5.81)
RDW: 13.5 % (ref 11.5–15.5)
WBC Count: 7.2 10*3/uL (ref 4.0–10.5)
nRBC: 0 % (ref 0.0–0.2)

## 2019-10-29 LAB — CMP (CANCER CENTER ONLY)
ALT: 31 U/L (ref 0–44)
AST: 21 U/L (ref 15–41)
Albumin: 4.3 g/dL (ref 3.5–5.0)
Alkaline Phosphatase: 72 U/L (ref 38–126)
Anion gap: 8 (ref 5–15)
BUN: 13 mg/dL (ref 6–20)
CO2: 29 mmol/L (ref 22–32)
Calcium: 9.4 mg/dL (ref 8.9–10.3)
Chloride: 101 mmol/L (ref 98–111)
Creatinine: 0.92 mg/dL (ref 0.61–1.24)
GFR, Est AFR Am: >60 mL/min (ref >60)
GFR, Estimated: >60 mL/min (ref >60)
Glucose, Bld: 109 mg/dL — ABNORMAL HIGH (ref 70–99)
Potassium: 4.5 mmol/L (ref 3.5–5.1)
Sodium: 138 mmol/L (ref 135–145)
Total Bilirubin: 0.4 mg/dL (ref 0.3–1.2)
Total Protein: 7.5 g/dL (ref 6.5–8.1)

## 2019-10-29 NOTE — Telephone Encounter (Signed)
Return as needed 12/17 los

## 2019-10-29 NOTE — Progress Notes (Signed)
[facility] OFFICE PROGRESS NOTE  Patient Care Team: Isaac Bliss, Rayford Halsted, MD as PCP - General (Internal Medicine)  HEME/ONC OVERVIEW: 1. R portal vein thrombosis, most likely due to acute intra-abdominal inflammation -Late 12/2018: CT AP (for abdominal pain) showed acute diverticulitis and an incidental partial R portal vein thrombosis; treated with Lovenox and discharged home w/ Xarelto  Hypercoagulable work-up, including APLS, PT mutation, Factor V Leiden, MPN and PNH, was all negative   04/2019: persistent R portal vein thrombosis, albeit less apparent, on CT  10/2019: chronic R portal vein thrombosis, no new or other thrombosis   TREATMENT REGIMEN:  01/12/2019 - 07/24/2019: Xarelto 20mg  daily  On observation  ASSESSMENT & PLAN:   R portal vein thrombosis -Most likely due to intra-abdominal inflammation in the setting of acute diverticulitis -Hypercoagulable work-up, including APLS, PT mutation, Factor V Leiden, MPN and PNH, was all negative  -Completed 6 months of Xarelto, stopped in 07/2019  -I reviewed the recent imaging results in detail with the patient -While CT abdomen/pelvis in 07/2019 was read as "no appreciable, visible residual R portal vein thrombosis," I reviewed the images with Dr. Kris Hartmann of radiology, who confirmed that the chronic portal vein thrombosis was present at that time, and there has been no change in the most recent scan in 10/2019  -I discussed the management of portal vein thrombosis in the setting of intra-abdominal infection, such as diverticulitis -As he had a provoked VTE and completed the appropriate duration of anticoagulation, the presence of chronic thrombosis is not an indication for indefinite anticoagulation, as a subset of patients with portal venous thrombosis do not achieve re-canulation  -I counseled the patient on some of the concerning symptoms, including acute persistent abdominal pain, which may suggest recurrent  portal vein thrombosis, and he should seek care for further evaluation  No orders of the defined types were placed in this encounter.  All questions were answered. The patient knows to call the clinic with any problems, questions or concerns. No barriers to learning was detected.  A total of more than 25 minutes were spent face-to-face with the patient during this encounter and over half of that time was spent on counseling and coordination of care as outlined above.   Return as needed.  Tish Men, MD 10/29/2019 12:37 PM  CHIEF COMPLAINT: "I have no problems"  INTERVAL HISTORY: Mr. Broome returns to clinic for follow-up of history of right portal vein thrombosis in the setting of acute diverticulitis.  Patient reports that since the last visit, he has not had any suspicious symptoms, such as recurrent abdominal pain, nausea, vomiting, diarrhea, hematochezia, or melena.  He changed his orthopedic physician to a new practice, who is planning for bilateral knee replacement, but the surgery has not yet been scheduled until the patient can lose weight.  He has been participating in physical therapy to try to lose some weight.  He denies any other complaint today.  REVIEW OF SYSTEMS:   Constitutional: ( - ) fevers, ( - )  chills , ( - ) night sweats Eyes: ( - ) blurriness of vision, ( - ) double vision, ( - ) watery eyes Ears, nose, mouth, throat, and face: ( - ) mucositis, ( - ) sore throat Respiratory: ( - ) cough, ( - ) dyspnea, ( - ) wheezes Cardiovascular: ( - ) palpitation, ( - ) chest discomfort, ( - ) lower extremity swelling Gastrointestinal:  ( - ) nausea, ( - ) heartburn, ( - )  change in bowel habits Skin: ( - ) abnormal skin rashes Lymphatics: ( - ) new lymphadenopathy, ( - ) easy bruising Neurological: ( - ) numbness, ( - ) tingling, ( - ) new weaknesses Behavioral/Psych: ( - ) mood change, ( - ) new changes  All other systems were reviewed with the patient and are  negative.  SUMMARY OF ONCOLOGIC HISTORY: Oncology History   No history exists.    I have reviewed the past medical history, past surgical history, social history and family history with the patient and they are unchanged from previous note.  ALLERGIES:  is allergic to banana; bee venom; and other.  MEDICATIONS:  Current Outpatient Medications  Medication Sig Dispense Refill  . diclofenac sodium (VOLTAREN) 1 % GEL Apply 2 g topically 4 (four) times daily. 100 g 3  . EPINEPHrine 0.3 mg/0.3 mL IJ SOAJ injection Inject 0.3 mLs (0.3 mg total) into the muscle as needed for anaphylaxis (bee stings). (Patient not taking: Reported on 10/29/2019) 2 Device 0   No current facility-administered medications for this visit.    PHYSICAL EXAMINATION: ECOG PERFORMANCE STATUS: 0 - Asymptomatic  Today's Vitals   10/29/19 1200  BP: 138/87  Pulse: 73  Resp: 19  Temp: 97.9 F (36.6 C)  TempSrc: Temporal  SpO2: 99%  Weight: (!) 316 lb (143.3 kg)  Height: 5\' 9"  (1.753 m)  PainSc: 0-No pain   Body mass index is 46.67 kg/m.  Filed Weights   10/29/19 1200  Weight: (!) 316 lb (143.3 kg)    GENERAL: alert, no distress and comfortable SKIN: skin color, texture, turgor are normal, no rashes or significant lesions EYES: conjunctiva are pink and non-injected, sclera clear OROPHARYNX: no exudate, no erythema; lips, buccal mucosa, and tongue normal  NECK: supple, non-tender LUNGS: clear to auscultation with normal breathing effort HEART: regular rate & rhythm and no murmurs and trace bilateral lower extremity edema, wearing compression stockings  ABDOMEN: soft, non-tender, non-distended, normal bowel sounds Musculoskeletal: no cyanosis of digits and no clubbing  PSYCH: alert & oriented x 3, fluent speech  LABORATORY DATA:  I have reviewed the data as listed    Component Value Date/Time   NA 138 10/29/2019 1137   K 4.5 10/29/2019 1137   CL 101 10/29/2019 1137   CO2 29 10/29/2019 1137    GLUCOSE 109 (H) 10/29/2019 1137   BUN 13 10/29/2019 1137   CREATININE 0.92 10/29/2019 1137   CALCIUM 9.4 10/29/2019 1137   PROT 7.5 10/29/2019 1137   ALBUMIN 4.3 10/29/2019 1137   AST 21 10/29/2019 1137   ALT 31 10/29/2019 1137   ALKPHOS 72 10/29/2019 1137   BILITOT 0.4 10/29/2019 1137   GFRNONAA >60 10/29/2019 1137   GFRAA >60 10/29/2019 1137    No results found for: SPEP, UPEP  Lab Results  Component Value Date   WBC 7.2 10/29/2019   NEUTROABS 4.4 10/29/2019   HGB 14.4 10/29/2019   HCT 45.5 10/29/2019   MCV 83.5 10/29/2019   PLT 294 10/29/2019      Chemistry      Component Value Date/Time   NA 138 10/29/2019 1137   K 4.5 10/29/2019 1137   CL 101 10/29/2019 1137   CO2 29 10/29/2019 1137   BUN 13 10/29/2019 1137   CREATININE 0.92 10/29/2019 1137      Component Value Date/Time   CALCIUM 9.4 10/29/2019 1137   ALKPHOS 72 10/29/2019 1137   AST 21 10/29/2019 1137   ALT 31 10/29/2019 1137  BILITOT 0.4 10/29/2019 1137       RADIOGRAPHIC STUDIES: I have personally reviewed the radiological images as listed below and agreed with the findings in the report. CT ABDOMEN PELVIS W CONTRAST  Result Date: 10/26/2019 CLINICAL DATA:  Follow-up portal vein thrombosis. Cirrhosis. EXAM: CT ABDOMEN AND PELVIS WITH CONTRAST TECHNIQUE: Multidetector CT imaging of the abdomen and pelvis was performed using the standard protocol following bolus administration of intravenous contrast. CONTRAST:  177mL OMNIPAQUE IOHEXOL 300 MG/ML  SOLN COMPARISON:  07/17/2019 FINDINGS: Lower Chest: No acute findings. Hepatobiliary: Hepatic cirrhosis is again demonstrated. No hepatic masses identified. Diminutive size of the right portal vein is again seen which shows poor enhancement. This appearance is unchanged since previous study consistent with chronic thrombosis. No acute portal vein thrombus is seen. Gallbladder is unremarkable. No evidence of biliary ductal dilatation. Pancreas:  No mass or  inflammatory changes. Spleen: Within normal limits in size and appearance. Adrenals/Urinary Tract: No masses identified. No evidence of hydronephrosis. Stomach/Bowel: No evidence of obstruction, inflammatory process or abnormal fluid collections. Focal area of chronic fat necrosis is seen in the omental fat which is unchanged. Diverticulosis is seen mainly involving the descending and sigmoid colon, however there is no evidence of diverticulitis. Vascular/Lymphatic: No pathologically enlarged lymph nodes. No abdominal aortic aneurysm. Reproductive:  No mass or other significant abnormality. Other:  None. Musculoskeletal:  No suspicious bone lesions identified. IMPRESSION: 1. Hepatic cirrhosis. No evidence of hepatic neoplasm or other acute findings. 2. Stable chronic thrombosis of right portal vein. No acute portal vein thrombus. 3. Colonic diverticulosis. No radiographic evidence of diverticulitis. Electronically Signed   By: Marlaine Hind M.D.   On: 10/26/2019 11:16

## 2019-11-03 ENCOUNTER — Other Ambulatory Visit: Payer: BC Managed Care – PPO

## 2019-11-03 ENCOUNTER — Ambulatory Visit: Payer: BC Managed Care – PPO | Admitting: Hematology

## 2020-06-17 ENCOUNTER — Other Ambulatory Visit: Payer: Self-pay

## 2020-06-17 ENCOUNTER — Emergency Department (HOSPITAL_BASED_OUTPATIENT_CLINIC_OR_DEPARTMENT_OTHER)
Admission: EM | Admit: 2020-06-17 | Discharge: 2020-06-17 | Disposition: A | Discharge status: Home / Self Care | Payer: Commercial Managed Care - PPO | Attending: Emergency Medicine | Admitting: Emergency Medicine

## 2020-06-17 ENCOUNTER — Emergency Department (HOSPITAL_BASED_OUTPATIENT_CLINIC_OR_DEPARTMENT_OTHER): Payer: Commercial Managed Care - PPO

## 2020-06-17 ENCOUNTER — Encounter (HOSPITAL_BASED_OUTPATIENT_CLINIC_OR_DEPARTMENT_OTHER): Payer: Self-pay | Admitting: Emergency Medicine

## 2020-06-17 DIAGNOSIS — J45909 Unspecified asthma, uncomplicated: Secondary | ICD-10-CM | POA: Insufficient documentation

## 2020-06-17 DIAGNOSIS — K5732 Diverticulitis of large intestine without perforation or abscess without bleeding: Secondary | ICD-10-CM | POA: Diagnosis not present

## 2020-06-17 DIAGNOSIS — K5792 Diverticulitis of intestine, part unspecified, without perforation or abscess without bleeding: Secondary | ICD-10-CM

## 2020-06-17 DIAGNOSIS — K921 Melena: Secondary | ICD-10-CM | POA: Diagnosis present

## 2020-06-17 LAB — COMPREHENSIVE METABOLIC PANEL
ALT: 49 U/L — ABNORMAL HIGH (ref 0–44)
AST: 32 U/L (ref 15–41)
Albumin: 4 g/dL (ref 3.5–5.0)
Alkaline Phosphatase: 77 U/L (ref 38–126)
Anion gap: 10 (ref 5–15)
BUN: 13 mg/dL (ref 6–20)
CO2: 26 mmol/L (ref 22–32)
Calcium: 9.2 mg/dL (ref 8.9–10.3)
Chloride: 101 mmol/L (ref 98–111)
Creatinine, Ser: 1.05 mg/dL (ref 0.61–1.24)
GFR calc Af Amer: >60 mL/min (ref >60)
GFR calc non Af Amer: >60 mL/min (ref >60)
Glucose, Bld: 118 mg/dL — ABNORMAL HIGH (ref 70–99)
Potassium: 3.6 mmol/L (ref 3.5–5.1)
Sodium: 137 mmol/L (ref 135–145)
Total Bilirubin: 0.6 mg/dL (ref 0.3–1.2)
Total Protein: 8 g/dL (ref 6.5–8.1)

## 2020-06-17 LAB — CBC
HCT: 44.6 % (ref 39.0–52.0)
Hemoglobin: 14.6 g/dL (ref 13.0–17.0)
MCH: 27 pg (ref 26.0–34.0)
MCHC: 32.7 g/dL (ref 30.0–36.0)
MCV: 82.6 fL (ref 80.0–100.0)
Platelets: 319 10*3/uL (ref 150–400)
RBC: 5.4 MIL/uL (ref 4.22–5.81)
RDW: 13.7 % (ref 11.5–15.5)
WBC: 8.5 10*3/uL (ref 4.0–10.5)
nRBC: 0 % (ref 0.0–0.2)

## 2020-06-17 MED ORDER — HYDROCODONE-ACETAMINOPHEN 5-325 MG PO TABS: 1.0000 | ORAL_TABLET | Freq: Four times a day (QID) | ORAL | 0 refills | Status: DC | PRN

## 2020-06-17 MED ORDER — MORPHINE SULFATE (PF) 4 MG/ML IV SOLN
4.0000 mg | Freq: Once | INTRAVENOUS | Status: AC
  Administered 2020-06-17: 4 mg via INTRAVENOUS
  Filled 2020-06-17: qty 1

## 2020-06-17 MED ORDER — AMOXICILLIN-POT CLAVULANATE 875-125 MG PO TABS
1.0000 | ORAL_TABLET | Freq: Once | ORAL | Status: AC
  Administered 2020-06-17: 1 via ORAL
  Filled 2020-06-17: qty 1

## 2020-06-17 MED ORDER — ONDANSETRON HCL 4 MG/2ML IJ SOLN
4.0000 mg | Freq: Once | INTRAMUSCULAR | Status: AC
  Administered 2020-06-17: 4 mg via INTRAVENOUS
  Filled 2020-06-17: qty 2

## 2020-06-17 MED ORDER — LACTATED RINGERS IV SOLN
INTRAVENOUS | Status: DC
  Administered 2020-06-17: 100 mL/h via INTRAVENOUS

## 2020-06-17 MED ORDER — IOHEXOL 300 MG/ML  SOLN
100.0000 mL | Freq: Once | INTRAMUSCULAR | Status: AC | PRN
  Administered 2020-06-17: 100 mL via INTRAVENOUS

## 2020-06-17 MED ORDER — AMOXICILLIN-POT CLAVULANATE 875-125 MG PO TABS: 1.0000 | ORAL_TABLET | Freq: Two times a day (BID) | ORAL | 0 refills | Status: DC

## 2020-06-17 NOTE — ED Triage Notes (Signed)
Reports noticing blood in toilet that started last weekend.  Fasted Mon, Marc May, Wed.  Didn't see any bleeding Wednesday but started back today.

## 2020-06-17 NOTE — ED Provider Notes (Signed)
[facility] EMERGENCY DEPARTMENT Provider Note   CSN: [id number] Arrival date & time: 06/17/20  1805     History Chief Complaint  Patient presents with   GI Bleeding    Marc May is a 44 y.o. male.  Patient is a 44 year old male with a history of prior diverticulitis and portal thrombosis thought to be related to significant inflammation who completed 6 months of Xarelto who is presenting today with blood in his stools and lower abdominal pain.  Patient reports that symptoms started approximately 1 week ago.  He has had a total of 5 stools that have had blood in them.  He reports is a mix between dark blood and bright red blood.  It is not painful for him to have a bowel movement and he denies any passage of clots.  Since the week has progressed he has had worsening left lower quadrant abdominal pain as well as now a little bit of right lower quadrant abdominal pain.  Mild nausea today but no vomiting.  He has had significant decreased in his oral intake which he states helped with his symptoms but when he tried to eat or drink anything the pain and bloody stools would return.  He denies any fever and is not currently anticoagulated.  He has no chest pain, cough but did feel slightly lightheaded today.  The history is provided by the patient.       Past Medical History:  Diagnosis Date   Asthma    Diverticulitis    Dizziness    GERD (gastroesophageal reflux disease)    Hernia of abdominal cavity    History of blood clots    Migraines    Osteoarthritis    knees   Sleep apnea     Patient Active Problem List   Diagnosis Date Noted   Unilateral primary osteoarthritis, left knee 07/29/2019   Unilateral primary osteoarthritis, right knee 07/29/2019   Abnormal CT scan, colon 04/24/2019   Microcytic anemia 01/07/2019   Portal vein thrombosis 01/06/2019   Acute diverticulitis 01/06/2019   Hyponatremia 01/06/2019   OSA (obstructive sleep apnea)  42/68/3419   Umbilical hernia 62/22/9798   Strangulated umbilical hernia 92/09/9416   Morbid obesity with BMI of 40.0-44.9, adult ([place]) 02/28/2016   Gastroesophageal reflux disease without esophagitis 02/28/2016   Primary osteoarthritis of both knees 02/28/2016   Chronic constipation 02/28/2016    Past Surgical History:  Procedure Laterality Date   KNEE ARTHROSCOPY  4081   UMBILICAL HERNIA REPAIR N/A 11/29/2017   Procedure: HERNIA REPAIR UMBILICAL ADULT;  Surgeon: Kinsinger, Arta Bruce, MD;  Location: [facility];  Service: General;  Laterality: N/A;       Family History  Problem Relation Age of Onset   Arthritis Mother    Asthma Mother    Ovarian cancer Mother    Diverticulitis Mother    Cancer Father    Diabetes Father    Arthritis Sister    Alcohol abuse Brother    Arthritis Brother    Depression Brother    Diabetes Brother    Drug abuse Brother    Esophageal cancer Paternal Grandfather    Stomach cancer Paternal Grandfather    Arthritis Brother    Arthritis Brother    Colon cancer Neg Hx    Rectal cancer Neg Hx     Social History   Tobacco Use   Smoking status: Never Smoker   Smokeless tobacco: Never Used  Vaping Use   Vaping Use: Never used  Substance  Use Topics   Alcohol use: Not Currently   Drug use: Never    Home Medications Prior to Admission medications   Medication Sig Start Date End Date Taking? Authorizing Provider  diclofenac sodium (VOLTAREN) 1 % GEL Apply 2 g topically 4 (four) times daily. 02/03/19   Mcarthur Rossetti, MD  EPINEPHrine 0.3 mg/0.3 mL IJ SOAJ injection Inject 0.3 mLs (0.3 mg total) into the muscle as needed for anaphylaxis (bee stings). Patient not taking: Reported on 10/29/2019 01/21/19   Isaac Bliss, Rayford Halsted, MD    Allergies    Banana, Bee venom, and Other  Review of Systems   Review of Systems  All other systems reviewed and are negative.   Physical Exam Updated Vital Signs BP  133/76 (BP Location: Right Arm)    Pulse 84    Temp 98.7 F (37.1 C) (Oral)    Resp 20    Ht 5\' 9"  (1.753 m)    Wt 136.1 kg    SpO2 98%    BMI 44.30 kg/m   Physical Exam Vitals and nursing note reviewed.  Constitutional:      General: He is not in acute distress.    Appearance: Normal appearance. He is well-developed. He is obese.  HENT:     Head: Normocephalic and atraumatic.  Eyes:     Conjunctiva/sclera: Conjunctivae normal.     Pupils: Pupils are equal, round, and reactive to light.  Cardiovascular:     Rate and Rhythm: Normal rate and regular rhythm.     Heart sounds: No murmur heard.   Pulmonary:     Effort: Pulmonary effort is normal. No respiratory distress.     Breath sounds: Normal breath sounds. No wheezing or rales.  Abdominal:     General: There is no distension.     Palpations: Abdomen is soft.     Tenderness: There is abdominal tenderness in the left lower quadrant. There is guarding. There is no rebound.  Musculoskeletal:        General: No tenderness. Normal range of motion.     Cervical back: Normal range of motion and neck supple.     Right lower leg: No edema.     Left lower leg: No edema.  Skin:    General: Skin is warm and dry.     Findings: No erythema or rash.  Neurological:     General: No focal deficit present.     Mental Status: He is alert and oriented to person, place, and time. Mental status is at baseline.  Psychiatric:        Mood and Affect: Mood normal.        Behavior: Behavior normal.        Thought Content: Thought content normal.     ED Results / Procedures / Treatments   Labs (all labs ordered are listed, but only abnormal results are displayed) Labs Reviewed  COMPREHENSIVE METABOLIC PANEL - Abnormal; Notable for the following components:      Result Value   Glucose, Bld 118 (*)    ALT 49 (*)    All other components within normal limits  CBC  POC OCCULT BLOOD, ED    EKG None  Radiology CT ABDOMEN PELVIS W  CONTRAST  Result Date: 06/17/2020 CLINICAL DATA:  Bloody stool. EXAM: CT ABDOMEN AND PELVIS WITH CONTRAST TECHNIQUE: Multidetector CT imaging of the abdomen and pelvis was performed using the standard protocol following bolus administration of intravenous contrast. CONTRAST:  130mL OMNIPAQUE IOHEXOL 300  MG/ML  SOLN COMPARISON:  October 26, 2019 FINDINGS: Lower chest: No acute abnormality. Hepatobiliary: No focal liver abnormality is seen. No gallstones, gallbladder wall thickening, or biliary dilatation. Pancreas: Unremarkable. No pancreatic ductal dilatation or surrounding inflammatory changes. Spleen: Normal in size without focal abnormality. Adrenals/Urinary Tract: Adrenal glands are unremarkable. Kidneys are normal, without renal calculi, focal lesion, or hydronephrosis. Bladder is unremarkable. Stomach/Bowel: Stomach is within normal limits. Appendix appears normal. No evidence of bowel dilatation. Mildly inflamed diverticula are seen within the proximal sigmoid colon. There is no evidence of associated perforation or abscess. Vascular/Lymphatic: No significant vascular findings are present. No enlarged abdominal or pelvic lymph nodes. Reproductive: Prostate is unremarkable. Other: No abdominal wall hernia or abnormality. No abdominopelvic ascites. Musculoskeletal: No acute or significant osseous findings. IMPRESSION: Mild sigmoid diverticulitis without evidence of associated perforation or abscess. Electronically Signed   By: Virgina Norfolk M.D.   On: 06/17/2020 21:49    Procedures Procedures (including critical care time)  Medications Ordered in ED Medications  lactated ringers infusion (100 mL/hr Intravenous New Bag/Given 06/17/20 2102)  iohexol (OMNIPAQUE) 300 MG/ML solution 100 mL (has no administration in time range)  morphine 4 MG/ML injection 4 mg (4 mg Intravenous Given 06/17/20 2103)  ondansetron (ZOFRAN) injection 4 mg (4 mg Intravenous Given 06/17/20 2102)    ED Course  I have reviewed  the triage vital signs and the nursing notes.  Pertinent labs & imaging results that were available during my care of the patient were reviewed by me and considered in my medical decision making (see chart for details).    MDM Rules/Calculators/A&P                          Patient presenting with bloody stools and lower abdominal pain.  Prior history of diverticulitis and portal thrombosis related to inflammation.  He is no longer anticoagulated.  Concern for diverticulitis today based on exam versus other type of colitis.  No symptoms to suggest hemorrhoids.  Hemoglobin today is stable at 14 and white count within normal limits.  CMP with an ALT of 49 but otherwise within normal limits.  Patient given pain control and IV fluids.  CT pending.  10:30 PM CT shows mild diverticulitis without perforation or abscess.  Pt started on augmentin and recommended f/u with GI and PCP.  MDM Number of Diagnoses or Management Options   Amount and/or Complexity of Data Reviewed Clinical lab tests: ordered and reviewed Tests in the radiology section of CPT: ordered and reviewed Decide to obtain previous medical records or to obtain history from someone other than the patient: yes Obtain history from someone other than the patient: yes Review and summarize past medical records: yes Discuss the patient with other providers: no Independent visualization of images, tracings, or specimens: yes  Risk of Complications, Morbidity, and/or Mortality Presenting problems: moderate Diagnostic procedures: low Management options: low  Patient Progress Patient progress: improved   Final Clinical Impression(s) / ED Diagnoses Final diagnoses:  Diverticulitis    Rx / DC Orders ED Discharge Orders         Ordered    amoxicillin-clavulanate (AUGMENTIN) 875-125 MG tablet  Every 12 hours     Discontinue  Reprint     06/17/20 2233    HYDROcodone-acetaminophen (NORCO/VICODIN) 5-325 MG tablet  Every 6 hours PRN      Discontinue  Reprint     06/17/20 2233  Blanchie Dessert, MD 06/17/20 2235

## 2020-06-17 NOTE — Discharge Instructions (Signed)
Return if you start having fever or worsening pain, or persistent vomiting

## 2020-07-07 IMAGING — DX DG CHEST 1V PORT
1 series · 1 of 1 positions shown · non-contrast
Comparison: None.

CLINICAL DATA: Fever

EXAM:
PORTABLE CHEST 1 VIEW

[chest ap]
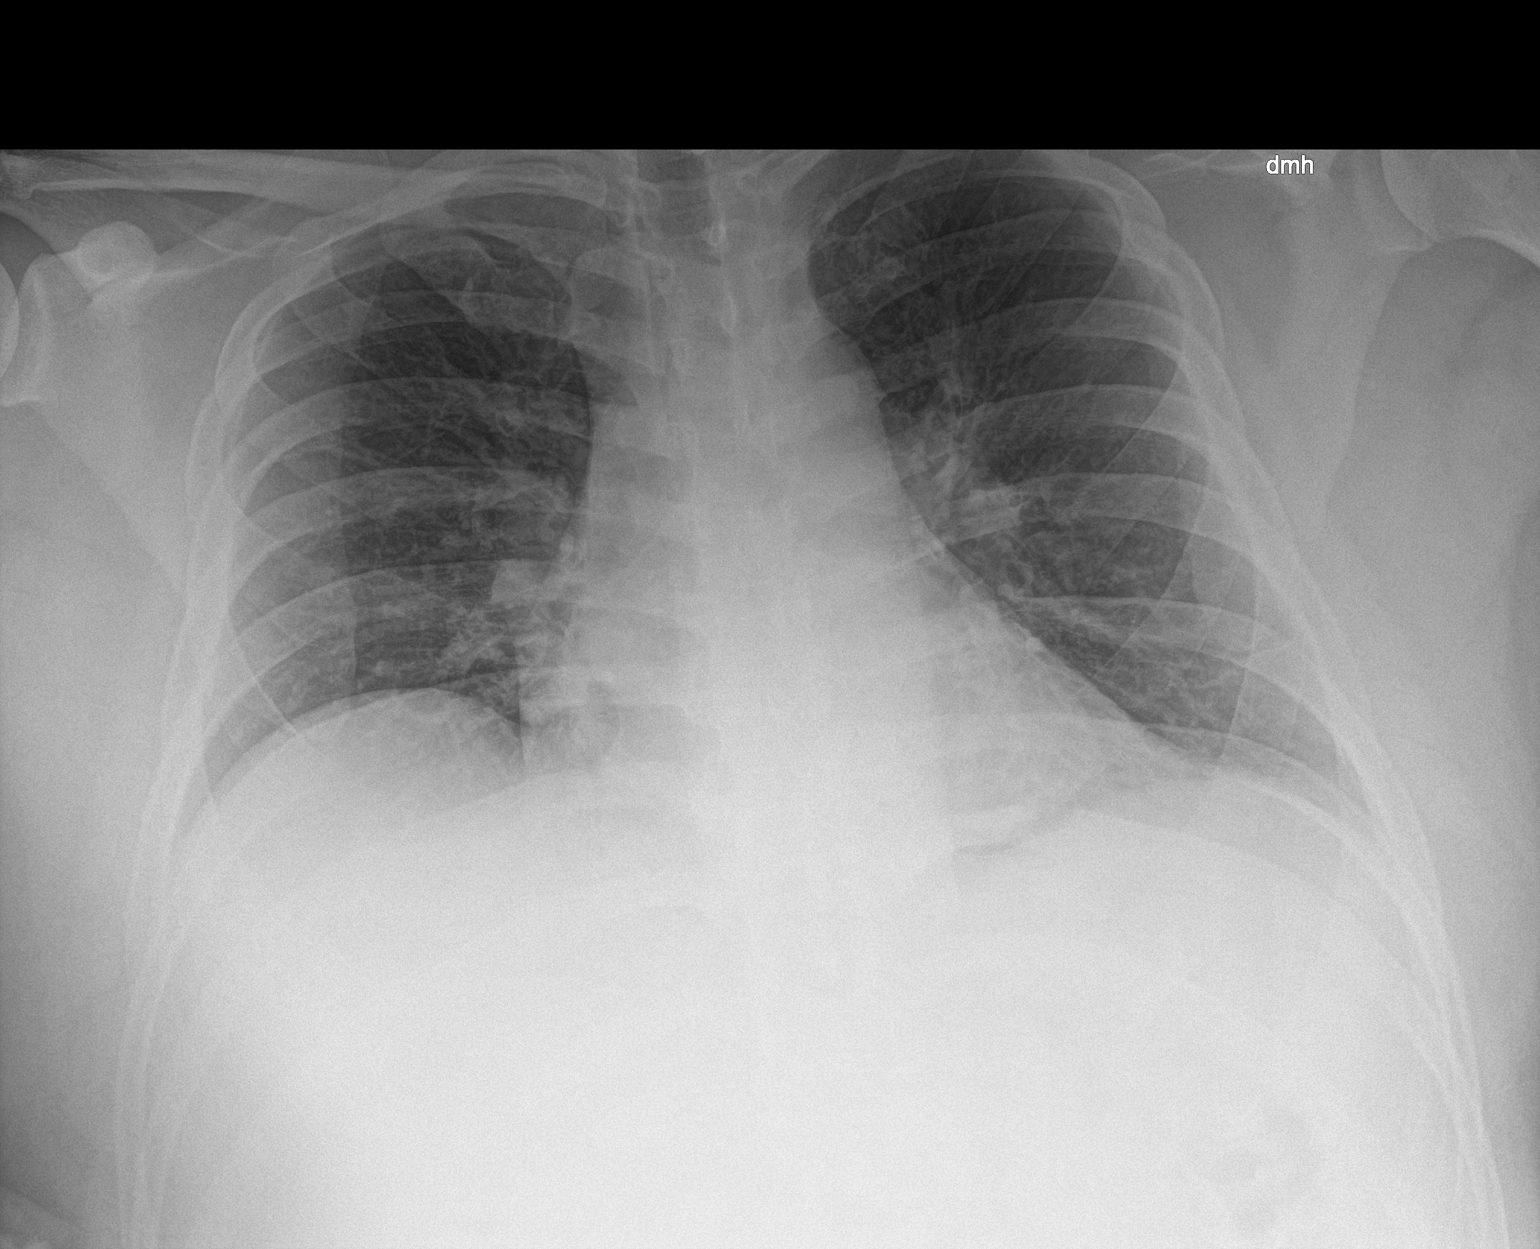

[1 of 1 positions shown; findings below may reference images not displayed]

FINDINGS: There is no appreciable edema or consolidation. The heart size and
pulmonary vascularity are normal. No adenopathy. No bone lesions.
IMPRESSION: No edema or consolidation.

## 2020-07-19 IMAGING — CT CT ANGIOGRAPHY CHEST
1 of 8 series · 3 of 16 positions shown · IV contrast (iopamidol)
Comparison: None.

CLINICAL DATA: Hx of diverticulitis and PE left lung recently. He
was started on blood thinners. He is here today with SOB, cough and
chest pain x 3 days. EKG at triage normal. His oxygen sat is 100%,
he is speaking in complete sentences and appears in no respiratory
distress. Pt is ambulatory.

EXAM:
CT ANGIOGRAPHY CHEST WITH CONTRAST
TECHNIQUE: Multidetector CT imaging of the chest was performed using the
standard protocol during bolus administration of intravenous
contrast. Multiplanar CT image reconstructions and MIPs were
obtained to evaluate the vascular anatomy.
CONTRAST:  100mL T99CVO-B0F IOPAMIDOL (T99CVO-B0F) INJECTION 76%

[Series 6: pe thins · axial · 0.75mm/px · z∈[-312,-8]mm · 3 of 305 slices shown]
[im 1/305  lung]
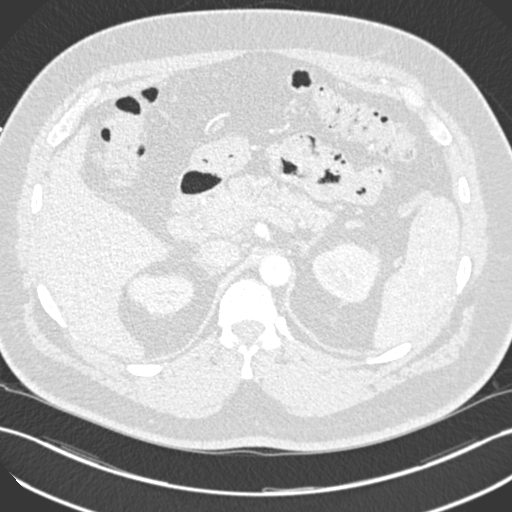
[im 153/305  soft-tissue]
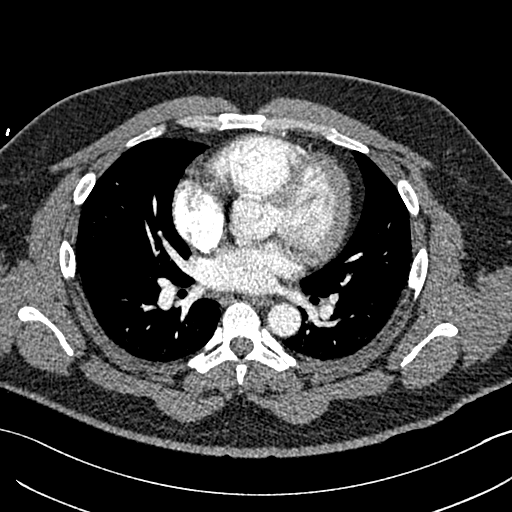
[im 305/305  lung]
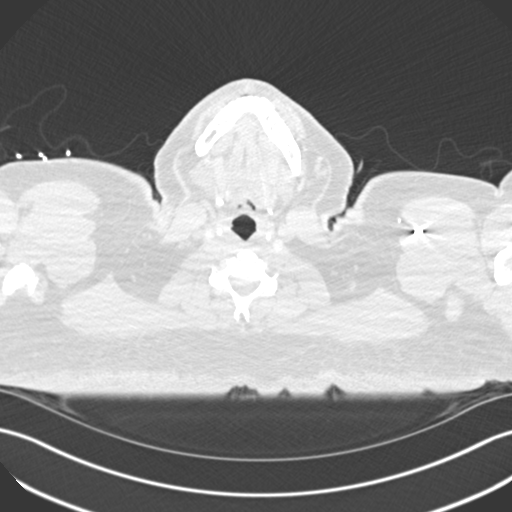

[3 of 16 positions shown; findings below may reference images not displayed]

FINDINGS: Cardiovascular: Heart size normal. No pericardial effusion.
Satisfactory opacification of pulmonary arteries noted, and there is
no evidence of pulmonary emboli. Adequate contrast opacification of
the thoracic aorta with no evidence of dissection, aneurysm, or
stenosis. There is classic 3-vessel brachiocephalic arch anatomy
without proximal stenosis. No significant atheromatous change.
Visualized proximal abdominal aorta unremarkable.

Mediastinum/Nodes: No hilar or mediastinal adenopathy.

Lungs/Pleura: No pleural effusion. No pneumothorax. Lungs are clear.

Upper Abdomen: No acute findings.

Musculoskeletal: Degenerative change in bilateral shoulders. No
fracture or worrisome bone lesion.

Review of the MIP images confirms the above findings.
IMPRESSION: Negative for acute PE or thoracic aortic dissection.

## 2020-07-25 IMAGING — US US ABDOMEN LIMITED
1 series · 14 of 25 positions shown · non-contrast
Comparison: CT 12/17/2018

CLINICAL DATA: Cirrhosis, abdominal pain, nausea and vomiting

EXAM:
ULTRASOUND ABDOMEN LIMITED RIGHT UPPER QUADRANT

[Series 1: us abdomen limited · 0.26mm/px · 14 of 44 slices shown]
[im 1/44]
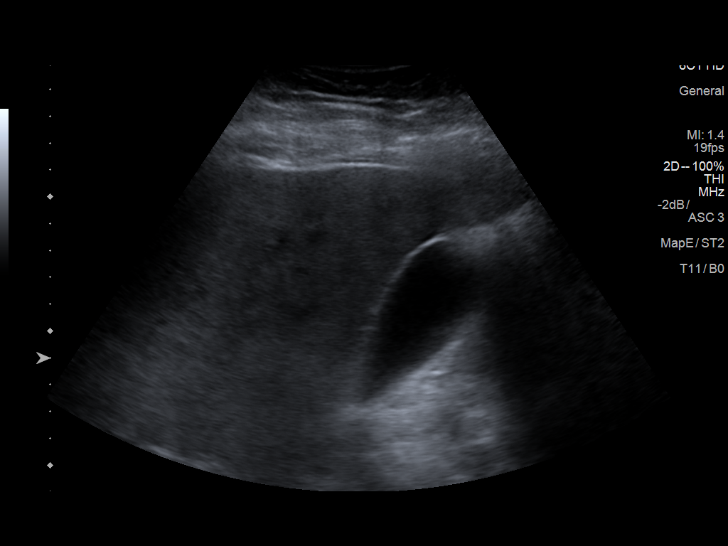
[im 4/44]
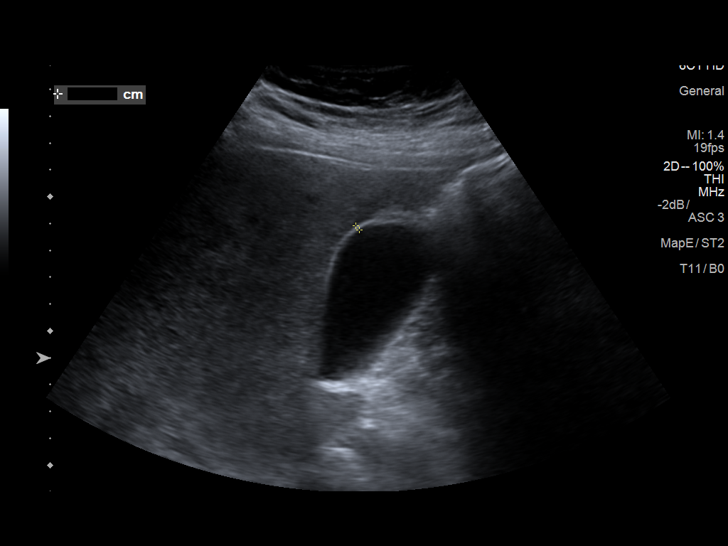
[im 8/44]
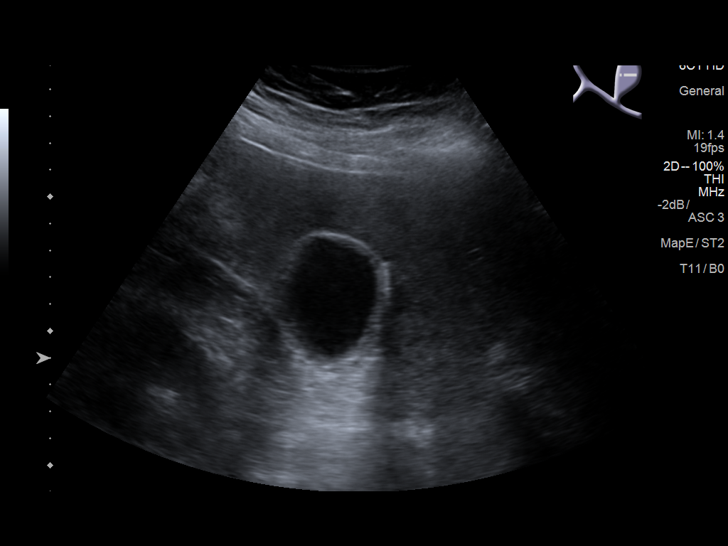
[im 11/44]
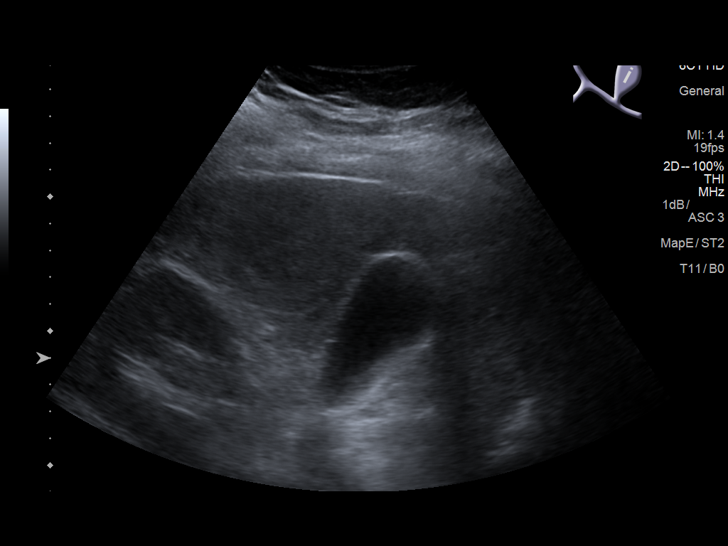
[im 15/44]
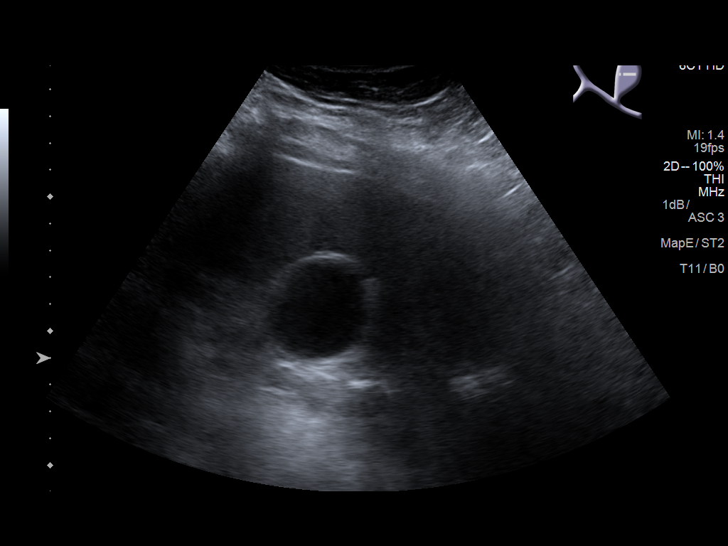
[im 17/44]
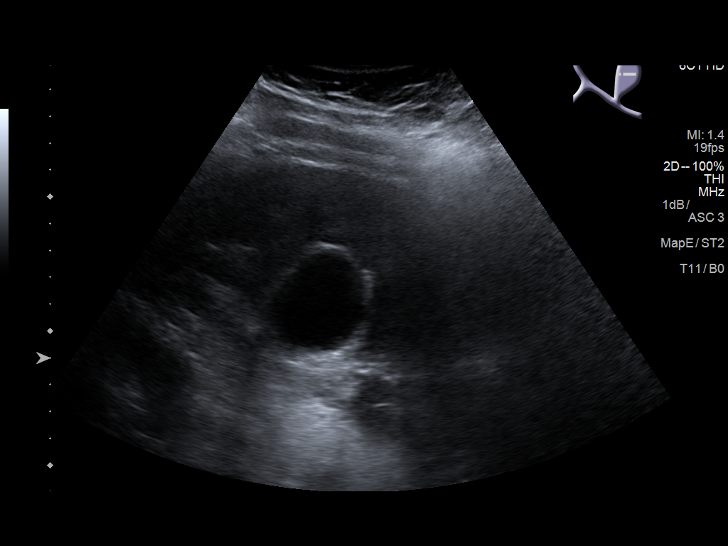
[im 20/44]
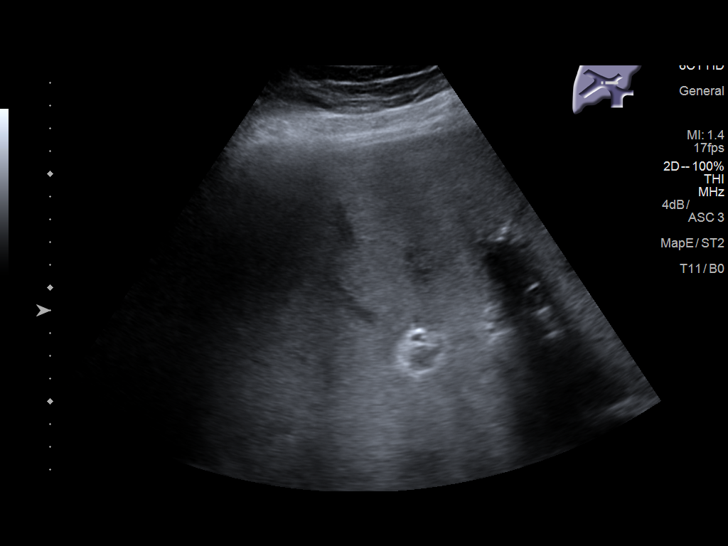
[im 24/44]
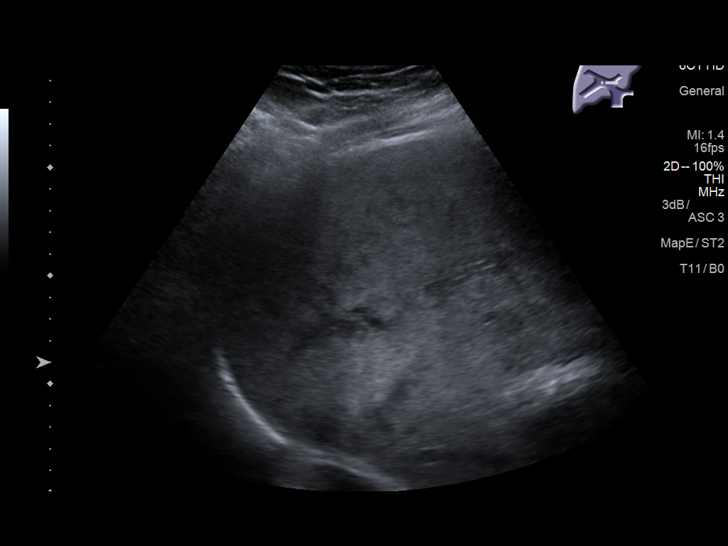
[im 27/44]
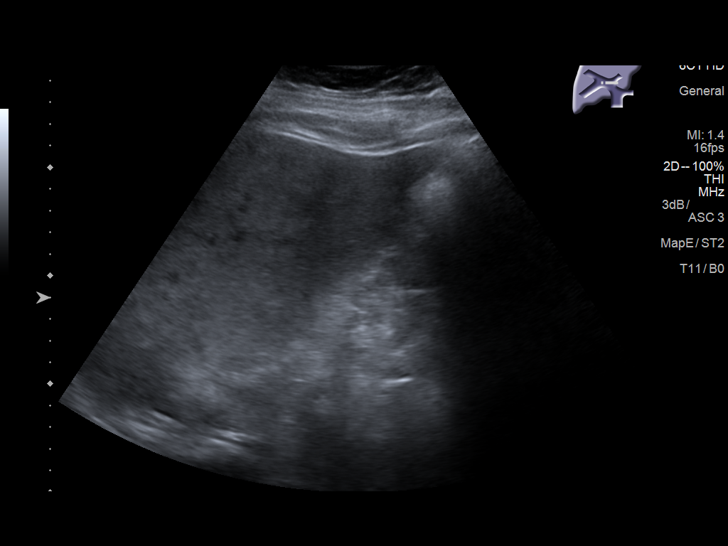
[im 29/44]
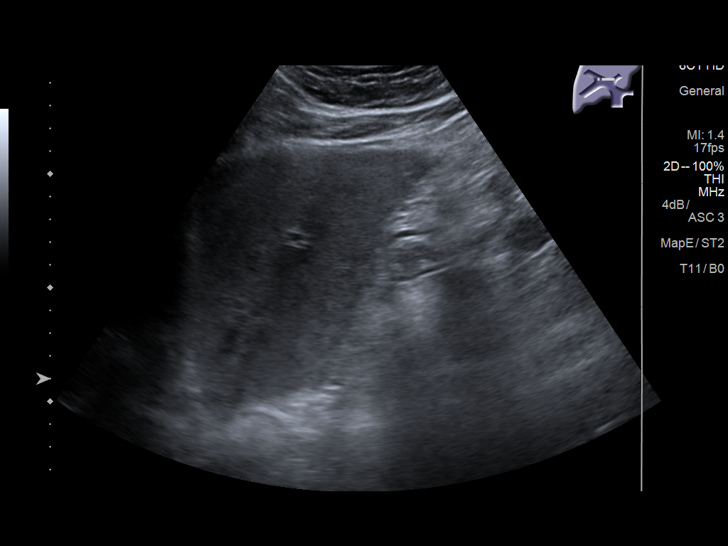
[im 33/44]
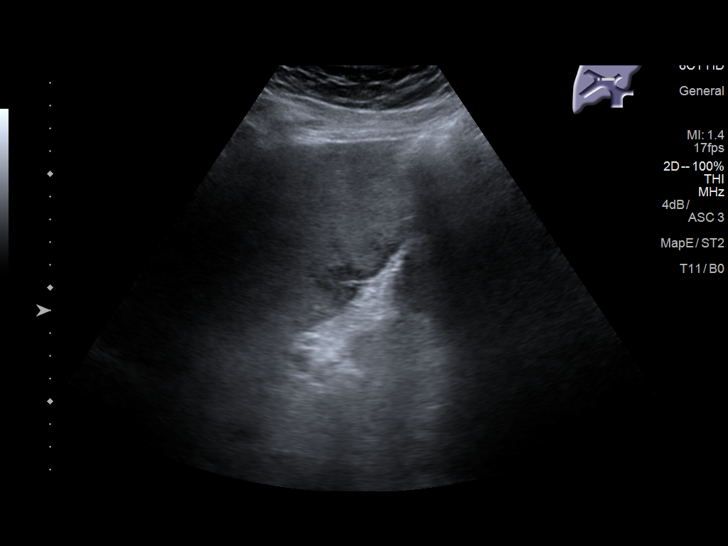
[im 36/44]
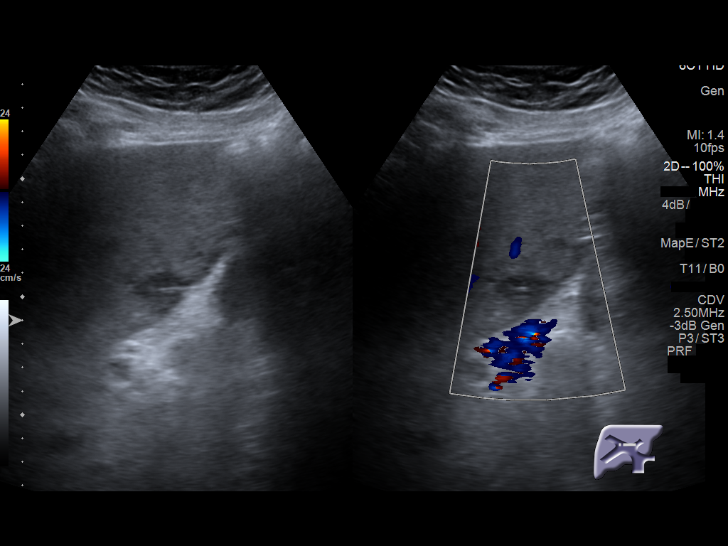
[im 40/44]
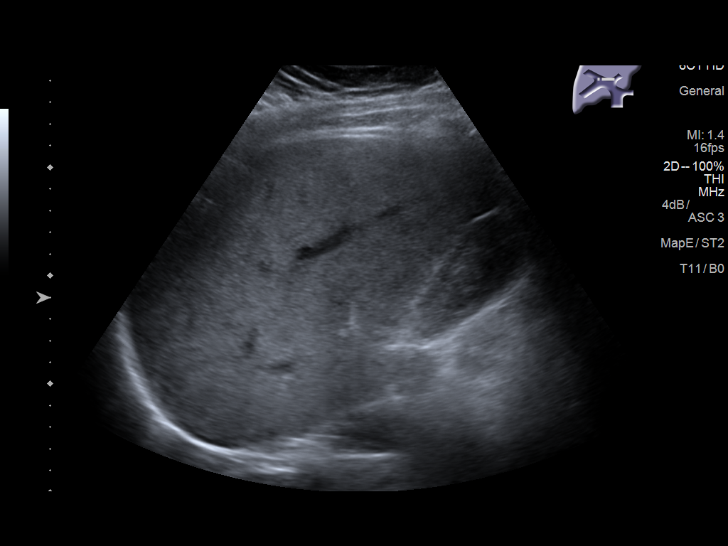
[im 44/44]
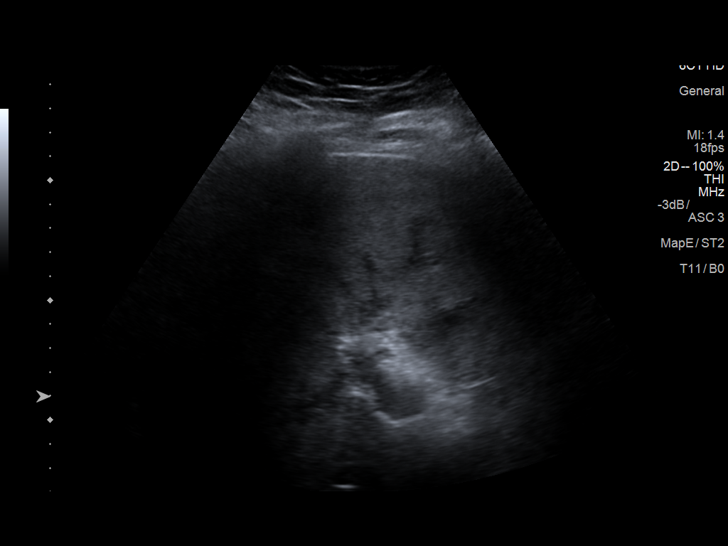

[14 of 25 positions shown; findings below may reference images not displayed]

FINDINGS: Gallbladder:

No gallstones or wall thickening visualized. No sonographic Murphy
sign noted by sonographer.

Common bile duct:

Diameter: 3.9 mm, unremarkable

Liver:

Heterogenous echogenic parenchyma. Nonspecific 2.7 cm subcapsular
hypoechoic region in the left lobe, without definite correlate on
prior CT. Main portal vein is patent on color Doppler imaging with
normal direction of blood flow towards the liver.
IMPRESSION: 1. Normal gallbladder.
2. Echogenic hepatic parenchyma with 2.7 cm hypoechoic left lobe
region may represent focal fatty sparing versus neoplasm. Recommend
attention on follow-up imaging.

## 2022-01-31 ENCOUNTER — Emergency Department (HOSPITAL_BASED_OUTPATIENT_CLINIC_OR_DEPARTMENT_OTHER)
Admission: EM | Admit: 2022-01-31 | Discharge: 2022-01-31 | Disposition: A | Discharge status: Home / Self Care | Payer: Commercial Managed Care - PPO | Attending: Emergency Medicine | Admitting: Emergency Medicine

## 2022-01-31 ENCOUNTER — Encounter (HOSPITAL_BASED_OUTPATIENT_CLINIC_OR_DEPARTMENT_OTHER): Payer: Self-pay

## 2022-01-31 ENCOUNTER — Other Ambulatory Visit: Payer: Self-pay

## 2022-01-31 ENCOUNTER — Emergency Department (HOSPITAL_BASED_OUTPATIENT_CLINIC_OR_DEPARTMENT_OTHER): Payer: Commercial Managed Care - PPO

## 2022-01-31 DIAGNOSIS — R0789 Other chest pain: Secondary | ICD-10-CM | POA: Insufficient documentation

## 2022-01-31 DIAGNOSIS — E119 Type 2 diabetes mellitus without complications: Secondary | ICD-10-CM | POA: Diagnosis not present

## 2022-01-31 DIAGNOSIS — R109 Unspecified abdominal pain: Secondary | ICD-10-CM | POA: Insufficient documentation

## 2022-01-31 DIAGNOSIS — R0602 Shortness of breath: Secondary | ICD-10-CM | POA: Diagnosis not present

## 2022-01-31 DIAGNOSIS — R079 Chest pain, unspecified: Secondary | ICD-10-CM

## 2022-01-31 LAB — CBC WITH DIFFERENTIAL/PLATELET
Abs Immature Granulocytes: 0.08 10*3/uL — ABNORMAL HIGH (ref 0.00–0.07)
Basophils Absolute: 0.1 10*3/uL (ref 0.0–0.1)
Basophils Relative: 1 %
Eosinophils Absolute: 0.2 10*3/uL (ref 0.0–0.5)
Eosinophils Relative: 2 %
HCT: 42.1 % (ref 39.0–52.0)
Hemoglobin: 13.9 g/dL (ref 13.0–17.0)
Immature Granulocytes: 1 %
Lymphocytes Relative: 30 %
Lymphs Abs: 2.7 10*3/uL (ref 0.7–4.0)
MCH: 27.5 pg (ref 26.0–34.0)
MCHC: 33 g/dL (ref 30.0–36.0)
MCV: 83.2 fL (ref 80.0–100.0)
Monocytes Absolute: 0.9 10*3/uL (ref 0.1–1.0)
Monocytes Relative: 10 %
Neutro Abs: 5 10*3/uL (ref 1.7–7.7)
Neutrophils Relative %: 56 %
Platelets: 274 10*3/uL (ref 150–400)
RBC: 5.06 MIL/uL (ref 4.22–5.81)
RDW: 13.8 % (ref 11.5–15.5)
WBC: 9 10*3/uL (ref 4.0–10.5)
nRBC: 0 % (ref 0.0–0.2)

## 2022-01-31 LAB — COMPREHENSIVE METABOLIC PANEL
ALT: 19 U/L (ref 0–44)
AST: 16 U/L (ref 15–41)
Albumin: 2.5 g/dL — ABNORMAL LOW (ref 3.5–5.0)
Alkaline Phosphatase: 82 U/L (ref 38–126)
Anion gap: 5 (ref 5–15)
BUN: 21 mg/dL — ABNORMAL HIGH (ref 6–20)
CO2: 27 mmol/L (ref 22–32)
Calcium: 8.7 mg/dL — ABNORMAL LOW (ref 8.9–10.3)
Chloride: 105 mmol/L (ref 98–111)
Creatinine, Ser: 0.92 mg/dL (ref 0.61–1.24)
GFR, Estimated: >60 mL/min (ref >60)
Glucose, Bld: 83 mg/dL (ref 70–99)
Potassium: 3.7 mmol/L (ref 3.5–5.1)
Sodium: 137 mmol/L (ref 135–145)
Total Bilirubin: 0.3 mg/dL (ref 0.3–1.2)
Total Protein: 7.3 g/dL (ref 6.5–8.1)

## 2022-01-31 LAB — TROPONIN I (HIGH SENSITIVITY)
Troponin I (High Sensitivity): 13 ng/L (ref <18)
Troponin I (High Sensitivity): 11 ng/L (ref <18)

## 2022-01-31 MED ORDER — SODIUM CHLORIDE 0.9 % IV BOLUS
1000.0000 mL | Freq: Once | INTRAVENOUS | Status: AC
  Administered 2022-01-31: 1000 mL via INTRAVENOUS

## 2022-01-31 MED ORDER — KETOROLAC TROMETHAMINE 30 MG/ML IJ SOLN
30.0000 mg | Freq: Once | INTRAMUSCULAR | Status: AC
  Administered 2022-01-31: 30 mg via INTRAVENOUS
  Filled 2022-01-31: qty 1

## 2022-01-31 MED ORDER — IOHEXOL 350 MG/ML SOLN
100.0000 mL | Freq: Once | INTRAVENOUS | Status: AC | PRN
  Administered 2022-01-31: 100 mL via INTRAVENOUS

## 2022-01-31 NOTE — ED Notes (Signed)
Patient discharged to home.  All discharge instructions reviewed.  Patient verbalized understanding via teachback method.  VS WDL.  Respirations even and unlabored.  Ambulatory out of ED.   °

## 2022-01-31 NOTE — ED Triage Notes (Signed)
Patient complains of chest pain described as tightness, dizziness, body tingling and shortness of breath.  Patient has pmh of type II diabetes and has not seen PCP for 90 days.  Symptoms began today about 2 hours pta.  Patient states chest pain radiates to neck and L arm.   ?

## 2022-01-31 NOTE — ED Provider Notes (Signed)
?[facility] ?Provider Note ? ? ?CSN: [id number] ?Arrival date & time: 01/31/22  0243 ? ?  ? ?History ? ?Chief Complaint  ?Patient presents with  ? Chest Pain  ? ? ?Keghan Mcfarren is a 46 y.o. male. ? ?Patient is a 46 year old male with past medical history of type 2 diabetes, portal vein thrombosis not currently anticoagulated.  Patient presenting today with complaints of chest discomfort.  This started this evening as he was attempting to sleep.  He describes the acute onset of tightness in the center of his chest with associated shortness of breath and tingling throughout his body.  He denies any nausea or diaphoresis.  He denies any recent fevers, chills, or cough. ? ?He has no prior cardiac history. ? ?The history is provided by the patient.  ? ?  ? ?Home Medications ?Prior to Admission medications   ?Medication Sig Start Date End Date Taking? Authorizing Provider  ?amoxicillin-clavulanate (AUGMENTIN) 875-125 MG tablet Take 1 tablet by mouth every 12 (twelve) hours. 06/17/20   Blanchie Dessert, MD  ?diclofenac sodium (VOLTAREN) 1 % GEL Apply 2 g topically 4 (four) times daily. 02/03/19   Mcarthur Rossetti, MD  ?EPINEPHrine 0.3 mg/0.3 mL IJ SOAJ injection Inject 0.3 mLs (0.3 mg total) into the muscle as needed for anaphylaxis (bee stings). ?Patient not taking: Reported on 10/29/2019 01/21/19   Isaac Bliss, Rayford Halsted, MD  ?HYDROcodone-acetaminophen (NORCO/VICODIN) 5-325 MG tablet Take 1 tablet by mouth every 6 (six) hours as needed. 06/17/20   Blanchie Dessert, MD  ?   ? ?Allergies    ?Banana, Bee venom, and Other   ? ?Review of Systems   ?Review of Systems  ?All other systems reviewed and are negative. ? ?Physical Exam ?Updated Vital Signs ?BP 124/70 (BP Location: Right Arm)   Pulse 76   Temp 97.8 ?F (36.6 ?C) (Oral)   Resp (!) 25   Ht '5\' 9"'$  (1.753 m)   Wt 134.3 kg   SpO2 98%   BMI 43.71 kg/m?  ?Physical Exam ?Vitals and nursing note reviewed.  ?Constitutional:   ?    General: He is not in acute distress. ?   Appearance: He is well-developed. He is not diaphoretic.  ?HENT:  ?   Head: Normocephalic and atraumatic.  ?Cardiovascular:  ?   Rate and Rhythm: Normal rate and regular rhythm.  ?   Heart sounds: No murmur heard. ?  No friction rub.  ?Pulmonary:  ?   Effort: Pulmonary effort is normal. No respiratory distress.  ?   Breath sounds: Normal breath sounds. No wheezing or rales.  ?Abdominal:  ?   General: Bowel sounds are normal. There is no distension.  ?   Palpations: Abdomen is soft.  ?   Tenderness: There is no abdominal tenderness.  ?Musculoskeletal:     ?   General: Normal range of motion.  ?   Cervical back: Normal range of motion and neck supple.  ?   Right lower leg: No tenderness. No edema.  ?   Left lower leg: No tenderness. No edema.  ?Skin: ?   General: Skin is warm and dry.  ?Neurological:  ?   Mental Status: He is alert and oriented to person, place, and time.  ?   Coordination: Coordination normal.  ? ? ?ED Results / Procedures / Treatments   ?Labs ?(all labs ordered are listed, but only abnormal results are displayed) ?Labs Reviewed  ?CBC WITH DIFFERENTIAL/PLATELET  ?COMPREHENSIVE METABOLIC PANEL  ?TROPONIN I (  HIGH SENSITIVITY)  ? ? ?EKG ?EKG Interpretation ? ?Date/Time:  Wednesday January 31 2022 02:51:37 EDT ?Ventricular Rate:  76 ?PR Interval:  164 ?QRS Duration: 101 ?QT Interval:  397 ?QTC Calculation: 447 ?R Axis:   31 ?Text Interpretation: Sinus rhythm Abnormal R-wave progression, early transition Confirmed by Veryl Speak [id number]) on 01/31/2022 2:53:39 AM ? ?Radiology ?No results found. ? ?Procedures ?Procedures  ?Continuous cardiac monitoring ? ?Medications Ordered in ED ?Medications  ?ketorolac (TORADOL) 30 MG/ML injection 30 mg (has no administration in time range)  ?sodium chloride 0.9 % bolus 1,000 mL (has no administration in time range)  ? ? ?ED Course/ Medical Decision Making/ A&P ? ?This patient presents to the ED for concern of chest discomfort,  this involves an extensive number of treatment options, and is a complaint that carries with it a high risk of complications and morbidity.  The differential diagnosis includes acute coronary syndrome, pulmonary embolism, aortic dissection, musculoskeletal etiology ? ? ?Co morbidities that complicate the patient evaluation ? ?None ? ? ?Additional history obtained: ? ?Additional history obtained from wife at bedside ?No external records needed ? ? ?Lab Tests: ? ?I Ordered, and personally interpreted labs.  The pertinent results include: Unremarkable CBC, metabolic panel, and troponin x2 ? ? ?Imaging Studies ordered: ? ?I ordered imaging studies including CTA of the chest and CT scan of the abdomen and pelvis ?I independently visualized and interpreted imaging which showed no acute process ?I agree with the radiologist interpretation ? ? ?Cardiac Monitoring: / EKG: ? ?The patient was maintained on a cardiac monitor.  I personally viewed and interpreted the cardiac monitored which showed an underlying rhythm of: Sinus rhythm ? ? ?Consultations Obtained: ? ?No consultations needed ? ? ?Problem List / ED Course / Critical interventions / Medication management ? ?Patient presenting with complaints of chest discomfort as described in the HPI.  His symptoms are somewhat atypical for cardiac pain and his work-up is unremarkable.   ?I ordered medication including Toradol for pain ?Reevaluation of the patient after these medicines showed that the patient somewhat improved ?I have reviewed the patients home medicines and have made adjustments as needed ? ? ?Social Determinants of Health: ? ?None ? ? ?Test / Admission - Considered: ? ?Patient's work-up is unremarkable including troponin x2, EKG, and CTA of the chest and abdomen.  I feel as though I have ruled out emergent pathology and patient can safely be discharged.  He is to follow-up as an outpatient and return as needed if symptoms worsen or change. ? ? ?Final Clinical  Impression(s) / ED Diagnoses ?Final diagnoses:  ?None  ? ? ?Rx / DC Orders ?ED Discharge Orders   ? ? None  ? ?  ? ? ?  ?Veryl Speak, MD ?01/31/22 [phone] ? ?

## 2022-01-31 NOTE — Discharge Instructions (Signed)
Rest. ? ?Return to the emergency department if you develop worsening pain, difficulty breathing, or other new and concerning symptoms. ?

## 2022-05-09 ENCOUNTER — Encounter: Payer: Self-pay | Admitting: Gastroenterology

## 2023-12-04 ENCOUNTER — Encounter (HOSPITAL_BASED_OUTPATIENT_CLINIC_OR_DEPARTMENT_OTHER): Payer: Self-pay

## 2023-12-04 ENCOUNTER — Emergency Department (HOSPITAL_BASED_OUTPATIENT_CLINIC_OR_DEPARTMENT_OTHER)
Admission: EM | Admit: 2023-12-04 | Discharge: 2023-12-04 | Disposition: A | Discharge status: Home / Self Care | Payer: Commercial Managed Care - PPO | Attending: Emergency Medicine | Admitting: Emergency Medicine

## 2023-12-04 ENCOUNTER — Other Ambulatory Visit: Payer: Self-pay

## 2023-12-04 DIAGNOSIS — H5789 Other specified disorders of eye and adnexa: Secondary | ICD-10-CM | POA: Insufficient documentation

## 2023-12-04 MED ORDER — TETRACAINE HCL 0.5 % OP SOLN
2.0000 [drp] | Freq: Once | OPHTHALMIC | Status: AC
Start: 2023-12-04 — End: 2023-12-04
  Administered 2023-12-04: 2 [drp] via OPHTHALMIC
  Filled 2023-12-04: qty 4

## 2023-12-04 MED ORDER — FLUORESCEIN SODIUM 1 MG OP STRP
1.0000 | ORAL_STRIP | Freq: Once | OPHTHALMIC | Status: AC
  Administered 2023-12-04: 1 via OPHTHALMIC
  Filled 2023-12-04: qty 1

## 2023-12-04 NOTE — ED Triage Notes (Signed)
Pt c/o pain to bilateral eyes. Pt reports he got his contact solutions mixed up this morning, he put Clear Care Plus in bilateral eyes instead of the correct contact solution. Pt reports he has put hot compresses on eyes and rinsed eyes with water.

## 2023-12-04 NOTE — ED Provider Notes (Signed)
[facility] Provider Note   CSN: [id number] Arrival date & time: 12/04/23  1424     History  Chief Complaint  Patient presents with   Eye Problem    Kijana Fruin is a 48 y.o. male without significant past medical history or reporting to emergency room after putting contact cleaning solution into his eyes instead of his normal contact solution.  Patient reports that both of his eyes immediately started "burning" and he was unable to immediately flush out his eyes.  He reports that his power was out.  Now with eyes feel itchy and painful.  Denies change in vision, blurry vision.    Eye Problem      Home Medications Prior to Admission medications   Medication Sig Start Date End Date Taking? Authorizing Provider  amoxicillin-clavulanate (AUGMENTIN) 875-125 MG tablet Take 1 tablet by mouth every 12 (twelve) hours. 06/17/20   Gwyneth Sprout, MD  diclofenac sodium (VOLTAREN) 1 % GEL Apply 2 g topically 4 (four) times daily. 02/03/19   Kathryne Hitch, MD  EPINEPHrine 0.3 mg/0.3 mL IJ SOAJ injection Inject 0.3 mLs (0.3 mg total) into the muscle as needed for anaphylaxis (bee stings). Patient not taking: Reported on 10/29/2019 01/21/19   Philip Aspen, Limmie Patricia, MD  HYDROcodone-acetaminophen (NORCO/VICODIN) 5-325 MG tablet Take 1 tablet by mouth every 6 (six) hours as needed. 06/17/20   Gwyneth Sprout, MD      Allergies    Banana, Bee venom, and Other    Review of Systems   Review of Systems  Eyes:  Positive for pain.    Physical Exam Updated Vital Signs BP (!) 150/97 (BP Location: Right Arm)   Pulse 89   Temp 98.5 F (36.9 C)   Resp 18   Ht 5\' 10"  (1.778 m)   Wt 117.9 kg   SpO2 98%   BMI 37.31 kg/m  Physical Exam Vitals and nursing note reviewed.  Constitutional:      General: He is not in acute distress.    Appearance: He is not toxic-appearing.  HENT:     Head: Normocephalic and atraumatic.  Eyes:      General: Lids are everted, no foreign bodies appreciated. No scleral icterus.       Right eye: No foreign body.        Left eye: No foreign body.     Intraocular pressure: Right eye pressure is 20 mmHg. Left eye pressure is 18 mmHg.     Conjunctiva/sclera:     Right eye: Right conjunctiva is injected. No exudate or hemorrhage.    Left eye: Left conjunctiva is injected. No exudate or hemorrhage. Cardiovascular:     Rate and Rhythm: Normal rate and regular rhythm.     Pulses: Normal pulses.     Heart sounds: Normal heart sounds.  Pulmonary:     Effort: Pulmonary effort is normal. No respiratory distress.     Breath sounds: Normal breath sounds.  Abdominal:     General: Abdomen is flat. Bowel sounds are normal.     Palpations: Abdomen is soft.     Tenderness: There is no abdominal tenderness.  Skin:    General: Skin is warm and dry.     Findings: No lesion.  Neurological:     General: No focal deficit present.     Mental Status: He is alert and oriented to person, place, and time. Mental status is at baseline.     ED Results / Procedures /  Treatments   Labs (all labs ordered are listed, but only abnormal results are displayed) Labs Reviewed - No data to display  EKG None  Radiology No results found.  Procedures Procedures    Medications Ordered in ED Medications  tetracaine (PONTOCAINE) 0.5 % ophthalmic solution 2 drop (has no administration in time range)  fluorescein ophthalmic strip 1 strip (has no administration in time range)    ED Course/ Medical Decision Making/ A&P                                 Medical Decision Making Risk Prescription drug management.   This patient presents to the ED for concern of FB in eye, this involves an extensive number of treatment options, and is a complaint that carries with it a high risk of complications and morbidity.  The differential diagnosis includes an abrasion, corneal ulcer, conjunctivitis, foreign body, chemical  burn   Problem List / ED Course / Critical interventions / Medication management  Reporting to emergency room today with eye pain bilaterally.  He reports this started after putting contact cleaning solution in his eye.  After flushing out his eyes his symptoms improved.  He does not note any acute change in vision.  At baseline he reports he has very poor vision in his right eye.  No corneal ulcer or abrasion seen on exam.  No foreign body.  Pupils are equal and reactive.  Given that his symptoms have improved and reassuring eye exam we will send him home for outpatient follow-up with eye doctor. Patient reports that he already has appointment scheduled for Thursday. Given return precautions. On exam no no foreign body, no corneal abrasion or corneal ulcer. Flushed out eyes with saline, patient reports improvement of symptoms and easier to open eyes. Normal pH. Offered to test IOP due to eye pain - within normal limites bilaterally. Patient feeling better, requesting discharge.  Reevaluation of the patient after these medicines showed that the patient improved I have reviewed the patients home medicines and have made adjustments as needed           Final Clinical Impression(s) / ED Diagnoses Final diagnoses:  Eye irritation    Rx / DC Orders ED Discharge Orders     None         Raford Pitcher, Evalee Jefferson 12/04/23 1725    Charlynne Pander, MD 12/04/23 [phone]

## 2023-12-04 NOTE — Discharge Instructions (Signed)
Please follow-up with your eye doctor next week, as discussed. I would recommend avoiding wearing your contact lenses and avoid rubbing your eyes. Please return to ER with new or worsening symptoms.

## 2024-03-30 NOTE — Progress Notes (Deleted)
 Office Visit Note   Patient: Marc May           Date of Birth: [birth year]           MRN: [id number] Visit Date: 03/31/2024              Requested by: Zilphia Hilt, Charyl Coppersmith, MD [address] [place],  [place] [address] PCP: Zilphia Hilt, Charyl Coppersmith, MD   Assessment & Plan: Visit Diagnoses:  1. Primary osteoarthritis of right knee   2. Primary osteoarthritis of left knee     Plan: ***  Follow-Up Instructions: No follow-ups on file.   Orders:  No orders of the defined types were placed in this encounter.  No orders of the defined types were placed in this encounter.     Procedures: No procedures performed   Clinical Data: No additional findings.   Subjective: No chief complaint on file.   HPI  Review of Systems  Constitutional: Negative.   HENT: Negative.    Eyes: Negative.   Respiratory: Negative.    Cardiovascular: Negative.   Gastrointestinal: Negative.   Endocrine: Negative.   Genitourinary: Negative.   Skin: Negative.   Allergic/Immunologic: Negative.   Neurological: Negative.   Hematological: Negative.   Psychiatric/Behavioral: Negative.    All other systems reviewed and are negative.   Objective: Vital Signs: There were no vitals taken for this visit.  Physical Exam Vitals and nursing note reviewed.  Constitutional:      Appearance: He is well-developed.  HENT:     Head: Normocephalic and atraumatic.  Eyes:     Pupils: Pupils are equal, round, and reactive to light.  Pulmonary:     Effort: Pulmonary effort is normal.  Abdominal:     Palpations: Abdomen is soft.  Musculoskeletal:        General: Normal range of motion.     Cervical back: Neck supple.  Skin:    General: Skin is warm.  Neurological:     Mental Status: He is alert and oriented to person, place, and time.  Psychiatric:        Behavior: Behavior normal.        Thought Content: Thought content normal.        Judgment: Judgment normal.   Ortho  Exam  Specialty Comments:  No specialty comments available.  Imaging: No results found.   PMFS History: Patient Active Problem List   Diagnosis Date Noted  . Unilateral primary osteoarthritis, left knee 07/29/2019  . Unilateral primary osteoarthritis, right knee 07/29/2019  . Abnormal CT scan, colon 04/24/2019  . Microcytic anemia 01/07/2019  . Portal vein thrombosis 01/06/2019  . Acute diverticulitis 01/06/2019  . Hyponatremia 01/06/2019  . OSA (obstructive sleep apnea) 01/06/2019  . Umbilical hernia 11/29/2017  . Strangulated umbilical hernia 11/29/2017  . Morbid obesity with BMI of 40.0-44.9, adult (HCC) 02/28/2016  . Gastroesophageal reflux disease without esophagitis 02/28/2016  . Primary osteoarthritis of both knees 02/28/2016  . Chronic constipation 02/28/2016   Past Medical History:  Diagnosis Date  . Asthma   . Diverticulitis   . Dizziness   . GERD (gastroesophageal reflux disease)   . Hernia of abdominal cavity   . History of blood clots   . Migraines   . Osteoarthritis    knees  . Sleep apnea     Family History  Problem Relation Age of Onset  . Arthritis Mother   . Asthma Mother   . Ovarian cancer Mother   . Diverticulitis Mother   .  Cancer Father   . Diabetes Father   . Arthritis Sister   . Alcohol abuse Brother   . Arthritis Brother   . Depression Brother   . Diabetes Brother   . Drug abuse Brother   . Esophageal cancer Paternal Grandfather   . Stomach cancer Paternal Grandfather   . Arthritis Brother   . Arthritis Brother   . Colon cancer Neg Hx   . Rectal cancer Neg Hx     Past Surgical History:  Procedure Laterality Date  . KNEE ARTHROSCOPY  2012  . UMBILICAL HERNIA REPAIR N/A 11/29/2017   Procedure: HERNIA REPAIR UMBILICAL ADULT;  Surgeon: Kinsinger, Alphonso Aschoff, MD;  Location: [facility] OR;  Service: General;  Laterality: N/A;   Social History   Occupational History  . Not on file  Tobacco Use  . Smoking status: Never    Passive  exposure: Never  . Smokeless tobacco: Never  Vaping Use  . Vaping status: Never Used  Substance and Sexual Activity  . Alcohol use: Not Currently  . Drug use: Never  . Sexual activity: Yes    Birth control/protection: None

## 2024-03-31 ENCOUNTER — Ambulatory Visit: Admitting: Orthopaedic Surgery

## 2024-05-20 ENCOUNTER — Other Ambulatory Visit: Payer: Self-pay | Admitting: Orthopedic Surgery

## 2024-05-26 NOTE — Progress Notes (Addendum)
 Patient NO SHOWED for this PST appt. X 2

## 2024-05-26 NOTE — Patient Instructions (Signed)
 SURGICAL WAITING ROOM VISITATION Patients having surgery or a procedure may have no more than 2 support people in the waiting area - these visitors may rotate in the visitor waiting room.   If the patient needs to stay at the hospital during part of their recovery, the visitor guidelines for inpatient rooms apply.  PRE-OP VISITATION  Pre-op nurse will coordinate an appropriate time for 1 support person to accompany the patient in pre-op.  This support person may not rotate.  This visitor will be contacted when the time is appropriate for the visitor to come back in the pre-op area.  Please refer to the [facility] website for the visitor guidelines for Inpatients (after your surgery is over and you are in a regular room).  You are not required to quarantine at this time prior to your surgery. However, you must do this: Hand Hygiene often Do NOT share personal items Notify your provider if you are in close contact with someone who has COVID or you develop fever 100.4 or greater, new onset of sneezing, cough, sore throat, shortness of breath or body aches.  If you test positive for Covid or have been in contact with anyone that has tested positive in the last 10 days please notify you surgeon.    Your procedure is scheduled on:  TUESDAY  June 09, 2024  Report to [facility] Main Entrance: [address] entrance where the [employer] is available.   Report to admitting at: 05:15    AM  Call this number if you have any questions or problems the morning of surgery [phone]  Do not eat food after Midnight the night prior to your surgery/procedure.  After Midnight you may have the following liquids until  04:30 AM DAY OF SURGERY  Clear Liquid Diet Water Black Coffee (sugar ok, NO MILK/CREAM OR CREAMERS)  Tea (sugar ok, NO MILK/CREAM OR CREAMERS) regular and decaf                             Plain Jell-O  with no fruit (NO RED)                                           Fruit ices  (not with fruit pulp, NO RED)                                     Popsicles (NO RED)                                                                  Juice: NO CITRUS JUICES: only apple, WHITE grape, WHITE cranberry Sports drinks like Gatorade or Powerade (NO RED)                   The day of surgery:  Drink ONE (1) Pre-Surgery Clear Ensure at   04:30     AM the morning of surgery. Drink in one sitting. Do not sip.  This drink was given to you during your hospital pre-op appointment visit. Nothing  else to drink after completing the Pre-Surgery Clear Ensure : No candy, chewing gum or throat lozenges.    FOLLOW ANY ADDITIONAL PRE OP INSTRUCTIONS YOU RECEIVED FROM YOUR SURGEON'S OFFICE!!!   Oral Hygiene is also important to reduce your risk of infection.        Remember - BRUSH YOUR TEETH THE MORNING OF SURGERY WITH YOUR REGULAR TOOTHPASTE  Do NOT smoke after Midnight the night before surgery.  STOP TAKING all Vitamins, Herbs and supplements 1 week before your surgery.   PHENTERMINE- Stop taking two (2) weeks before your surgery.   MOUNJARO- Stop injections 10-14 days before surgery. Last injection will be on Wednesday 05-27-24   Take ONLY these medicines the morning of surgery with A SIP OF WATER: Buspirone, Tramadol  if needed and Omeprazole if needed.    If You have been diagnosed with Sleep Apnea - Bring CPAP mask and tubing day of surgery. We will provide you with a CPAP machine on the day of your surgery.                   You may not have any metal on your body including  jewelry, and body piercing  Do not wear  lotions, powders, cologne, or deodorant  Men may shave face and neck.  Contacts, Hearing Aids, dentures or bridgework may not be worn into surgery. DENTURES WILL BE REMOVED PRIOR TO SURGERY PLEASE DO NOT APPLY Poly grip OR ADHESIVES!!!  Patients discharged on the day of surgery will not be allowed to drive home.  Someone NEEDS to stay with you for the first 24  hours after anesthesia.  Do not bring your home medications to the hospital. The Pharmacy will dispense medications listed on your medication list to you during your admission in the Hospital.  Please read over the following fact sheets you were given: IF YOU HAVE QUESTIONS ABOUT YOUR PRE-OP INSTRUCTIONS, PLEASE CALL [phone].     Pre-operative 5 CHG Bath Instructions   You can play a key role in reducing the risk of infection after surgery. Your skin needs to be as free of germs as possible. You can reduce the number of germs on your skin by washing with CHG (chlorhexidine gluconate) soap before surgery. CHG is an antiseptic soap that kills germs and continues to kill germs even after washing.   DO NOT use if you have an allergy to chlorhexidine/CHG or antibacterial soaps. If your skin becomes reddened or irritated, stop using the CHG and notify one of our RNs at [phone]  Please shower with the CHG soap starting 4 days before surgery using the following schedule: START SHOWERS ON  FRIDAY June 05, 2024  Please keep in mind the following:  DO NOT shave, including legs and underarms, starting the day of your first shower.   You may shave your face at any point before/day of surgery.   Place clean sheets on your bed the day you start using CHG soap. Use a clean washcloth (not used since being washed) for each shower. DO NOT sleep with pets once you start using the CHG.   CHG Shower Instructions:  If you choose to wash your hair and private area, wash first with your normal shampoo/soap.  After you use shampoo/soap, rinse your hair and body thoroughly to remove shampoo/soap residue.  Turn the water OFF and apply about 3 tablespoons (45 ml) of CHG soap to a CLEAN washcloth.  Apply CHG soap ONLY FROM YOUR NECK  DOWN TO YOUR TOES (washing for 3-5 minutes)  DO NOT use CHG soap on face, private areas, open wounds, or sores.  Pay special attention to the area where your surgery is being performed.  If you are having back surgery, having someone wash your back for you may be helpful.  Wait 2 minutes after CHG soap is applied, then you may rinse off the CHG soap.  Pat dry with a clean towel  Put on clean clothes/pajamas   If you choose to wear lotion, please use ONLY the CHG-compatible lotions on the back of this paper.     Additional instructions for the day of surgery: DO NOT APPLY any lotions, deodorants, cologne, or perfumes.   Put on clean/comfortable clothes.  Brush your teeth.  Ask your nurse before applying any prescription medications to the skin.      CHG Compatible Lotions   Aveeno Moisturizing lotion  Cetaphil Moisturizing Cream  Cetaphil Moisturizing Lotion  Clairol Herbal Essence Moisturizing Lotion, Dry Skin  Clairol Herbal Essence Moisturizing Lotion, Extra Dry Skin  Clairol Herbal Essence Moisturizing Lotion, Normal Skin  Curel Age Defying Therapeutic Moisturizing Lotion with Alpha Hydroxy  Curel Extreme Care Body Lotion  Curel Soothing Hands Moisturizing Hand Lotion  Curel Therapeutic Moisturizing Cream, Fragrance-Free  Curel Therapeutic Moisturizing Lotion, Fragrance-Free  Curel Therapeutic Moisturizing Lotion, Original Formula  Eucerin Daily Replenishing Lotion  Eucerin Dry Skin Therapy Plus Alpha Hydroxy Crme  Eucerin Dry Skin Therapy Plus Alpha Hydroxy Lotion  Eucerin Original Crme  Eucerin Original Lotion  Eucerin Plus Crme Eucerin Plus Lotion  Eucerin TriLipid Replenishing Lotion  Keri Anti-Bacterial Hand Lotion  Keri Deep Conditioning Original Lotion Dry Skin Formula Softly Scented  Keri Deep Conditioning Original Lotion, Fragrance Free Sensitive Skin Formula  Keri Lotion Fast Absorbing Fragrance Free Sensitive Skin Formula  Keri Lotion Fast Absorbing  Softly Scented Dry Skin Formula  Keri Original Lotion  Keri Skin Renewal Lotion Keri Silky Smooth Lotion  Keri Silky Smooth Sensitive Skin Lotion  Nivea Body Creamy Conditioning Oil  Nivea Body Extra Enriched Lotion  Nivea Body Original Lotion  Nivea Body Sheer Moisturizing Lotion Nivea Crme  Nivea Skin Firming Lotion  NutraDerm 30 Skin Lotion  NutraDerm Skin Lotion  NutraDerm Therapeutic Skin Cream  NutraDerm Therapeutic Skin Lotion  ProShield Protective Hand Cream  Provon moisturizing lotion   FAILURE TO FOLLOW THESE INSTRUCTIONS MAY RESULT IN THE CANCELLATION OF YOUR SURGERY  PATIENT SIGNATURE_________________________________  NURSE SIGNATURE__________________________________  ________________________________________________________________________        Marc May    An incentive spirometer is a tool that can help keep your lungs clear and active. This tool measures how well you are filling your lungs with each breath.  Taking long deep breaths may help reverse or decrease the chance of developing breathing (pulmonary) problems (especially infection) following: A long period of time when you are unable to move or be active. BEFORE THE PROCEDURE  If the spirometer includes an indicator to show your best effort, your nurse or respiratory therapist will set it to a desired goal. If possible, sit up straight or lean slightly forward. Try not to slouch. Hold the incentive spirometer in an upright position. INSTRUCTIONS FOR USE  Sit on the edge of your bed if possible, or sit up as far as you can in bed or on a chair. Hold the incentive spirometer in an upright position. Breathe out normally. Place the mouthpiece in your mouth and seal your lips tightly around it. Breathe in slowly and as deeply as possible, raising the piston or the ball toward the top of the column. Hold your breath for 3-5 seconds or for as long as possible. Allow the piston or ball to fall  to the bottom of the column. Remove the mouthpiece from your mouth and breathe out normally. Rest for a few seconds and repeat Steps 1 through 7 at least 10 times every 1-2 hours when you are awake. Take your time and take a few normal breaths between deep breaths. The spirometer may include an indicator to show your best effort. Use the indicator as a goal to work toward during each repetition. After each set of 10 deep breaths, practice coughing to be sure your lungs are clear. If you have an incision (the cut made at the time of surgery), support your incision when coughing by placing a pillow or rolled up towels firmly against it. Once you are able to get out of bed, walk around indoors and cough well. You may stop using the incentive spirometer when instructed by your caregiver.  RISKS AND COMPLICATIONS Take your time so you do not get dizzy or light-headed. If you are in pain, you may need to take or ask for pain medication before doing incentive spirometry. It is harder to take a deep breath if you are having pain. AFTER USE Rest and breathe slowly and easily. It can be helpful to keep track of a log of your progress. Your caregiver can provide you with a simple table to help with this. If you are using the spirometer at home, follow these instructions: SEEK MEDICAL CARE IF:  You are having difficultly using the spirometer. You have trouble using the spirometer as often as instructed. Your pain medication is not giving enough relief while using the spirometer. You develop fever of 100.5 F (38.1 C) or higher.                                                                                                    SEEK IMMEDIATE MEDICAL CARE IF:  You cough up bloody sputum that had not been present before. You develop fever of 102 F (38.9 C) or greater. You develop worsening pain at or near the incision site. MAKE SURE YOU:  Understand these instructions. Will watch your condition.  Will  get help right away if you are not doing well or get worse. Document Released: 03/11/2007 Document Revised: 01/21/2012 Document Reviewed: 05/12/2007 [facility] Patient Information 2014 [place], [place].      If you would like to see a video about joint replacement:   [email or web address]

## 2024-05-27 ENCOUNTER — Other Ambulatory Visit: Payer: Self-pay | Admitting: Orthopedic Surgery

## 2024-05-27 ENCOUNTER — Encounter (HOSPITAL_COMMUNITY)
Admission: RE | Admit: 2024-05-27 | Discharge: 2024-05-27 | Disposition: A | Discharge status: Home / Self Care | Source: Ambulatory Visit | Attending: Orthopedic Surgery | Admitting: Orthopedic Surgery

## 2024-05-27 DIAGNOSIS — M1711 Unilateral primary osteoarthritis, right knee: Secondary | ICD-10-CM

## 2024-05-27 DIAGNOSIS — T505X5D Adverse effect of appetite depressants, subsequent encounter: Secondary | ICD-10-CM

## 2024-05-27 DIAGNOSIS — Z01818 Encounter for other preprocedural examination: Secondary | ICD-10-CM

## 2024-05-27 DIAGNOSIS — Z79899 Other long term (current) drug therapy: Secondary | ICD-10-CM

## 2024-06-04 ENCOUNTER — Encounter (HOSPITAL_COMMUNITY): Admission: RE | Admit: 2024-06-04 | Source: Ambulatory Visit

## 2024-06-09 ENCOUNTER — Ambulatory Visit (HOSPITAL_COMMUNITY): Admission: RE | Admit: 2024-06-09 | Source: Home / Self Care | Admitting: Orthopedic Surgery

## 2024-06-09 ENCOUNTER — Encounter (HOSPITAL_COMMUNITY): Admission: RE | Payer: Self-pay | Source: Home / Self Care

## 2024-06-09 SURGERY — ARTHROPLASTY, KNEE, TOTAL
Anesthesia: Choice | Site: Knee | Laterality: Right

## 2024-06-15 ENCOUNTER — Other Ambulatory Visit: Payer: Self-pay | Admitting: Orthopedic Surgery

## 2024-06-21 NOTE — Progress Notes (Signed)
 COVID Vaccine received:  []  No []  Yes Date of any COVID positive Test in last 90 days:   PCP - Arleta Gave, MD at [facility], [place], [place] Phone: [phone] Fax: [phone]  medical clearance scanned to [employer].  Cardiologist - none   Chest x-ray -01-19-2019  2v  Epic  EKG -  01-2022 Epic will repeat  Stress Test -  ECHO -  Cardiac Cath -  CT Coronary Calcium score:    Pacemaker / ICD device [x]  No []  Yes   Spinal Cord Stimulator:[x]  No []  Yes       History of Sleep Apnea? []  No [x]  Yes   CPAP used?- []  No [x]  Yes     Patient has: []  NO Hx DM   [x]  Pre-DM   []  DM1  []   DM2 Does the patient monitor blood sugar?   []  N/A   []  No []  Yes  Last A1c was: 5.8 on 01-03-24 per Bethany        ?????MOUNJARO- for weight loss only  Wednesdays, Last injection 05-27-24   Blood Thinner / Instructions:  none Aspirin Instructions:  none   ERAS Protocol Ordered: []  No  [x]  Yes PRE-SURGERY []  ENSURE  [x]  G2   Patient is to be NPO after: 0430   Dental hx: []  Dentures:  []  N/A      []  Bridge or Partial:                   []  Loose or Damaged teeth:    Comments: Patient was given the 5 CHG shower / bath instructions for TKA surgery along with 2 bottles of the CHG soap. Patient will start this on:  Sunday  06-28-24      Activity level: Able to walk up 2 flights of stairs without becoming significantly short of breath or having chest pain?  []  No   []    Yes    Anesthesia review: phentermine use, GERD, anemia, asthma,. OSA-CPAP, Hx of portal vein thrombosis.    Patient denies any S&S of respiratory illness or Covid - no shortness of breath, fever, cough or chest pain at PAT appointment.   Patient verbalized understanding and agreement to the Pre-Surgical Instructions that were given to them at this PAT appointment. Patient was also educated of the need to review these PAT instructions again prior to his surgery.I reviewed the appropriate phone numbers to call if they have any and  questions or concerns.

## 2024-06-21 NOTE — Patient Instructions (Signed)
 SURGICAL WAITING ROOM VISITATION Patients having surgery or a procedure may have no more than 2 support people in the waiting area - these visitors may rotate in the visitor waiting room.   If the patient needs to stay at the hospital during part of their recovery, the visitor guidelines for inpatient rooms apply.   PRE-OP VISITATION  Pre-op nurse will coordinate an appropriate time for 1 support person to accompany the patient in pre-op.  This support person may not rotate.  This visitor will be contacted when the time is appropriate for the visitor to come back in the pre-op area.   Please refer to the [facility] website for the visitor guidelines for Inpatients (after your surgery is over and you are in a regular room).   You are not required to quarantine at this time prior to your surgery. However, you must do this: Hand Hygiene often Do NOT share personal items Notify your provider if you are in close contact with someone who has COVID or you develop fever 100.4 or greater, new onset of sneezing, cough, sore throat, shortness of breath or body aches.  If you test positive for Covid or have been in contact with anyone that has tested positive in the last 10 days please notify you surgeon.     Your procedure is scheduled on:  THURSDAY  July 02, 2024   Report to [facility] Main Entrance: [address] entrance where the [employer] is available.    Report to admitting at: 09:15    AM   Call this number if you have any questions or problems the morning of surgery [phone]   Do not eat food after Midnight the night prior to your surgery/procedure.   After Midnight you may have the following liquids until  08:45 AM DAY OF SURGERY   Clear Liquid Diet Water Black Coffee (sugar ok, NO MILK/CREAM OR CREAMERS)  Tea (sugar ok, NO MILK/CREAM OR CREAMERS) regular and decaf                             Plain Jell-O  with no fruit (NO RED)                                            Fruit ices (not with fruit pulp, NO RED)                                     Popsicles (NO RED)                                                                  Juice: NO CITRUS JUICES: only apple, WHITE grape, WHITE cranberry Sports drinks like Gatorade or Powerade (NO RED)                    FOLLOW ANY ADDITIONAL PRE OP INSTRUCTIONS YOU RECEIVED FROM YOUR SURGEON'S OFFICE!!!    Oral Hygiene is also important to reduce your risk of infection.        Remember -  BRUSH YOUR TEETH THE MORNING OF SURGERY WITH YOUR REGULAR TOOTHPASTE   Do NOT smoke after Midnight the night before surgery.   STOP TAKING all Vitamins, Herbs and supplements 1 week before your surgery.    PHENTERMINE- Stop taking two (2) weeks before your surgery.    ???????MOUNJARO- Stop injections 10-14 days before surgery. Last injection will be on Wednesday 06-17-24   Take ONLY these medicines the morning of surgery with A SIP OF WATER: Buspirone,Omeprazole if needed.     If You have been diagnosed with Sleep Apnea - Bring CPAP mask and tubing day of surgery. We will provide you with a CPAP machine on the day of your surgery.                   You may not have any metal on your body including  jewelry, and body piercing   Do not wear  lotions, powders, cologne, or deodorant   Men may shave face and neck.   Contacts, Hearing Aids, dentures or bridgework may not be worn into surgery. DENTURES WILL BE REMOVED PRIOR TO SURGERY PLEASE DO NOT APPLY Poly grip OR ADHESIVES!!!   Patients discharged on the day of surgery will not be allowed to drive home.  Someone NEEDS to stay with you for the first 24 hours after anesthesia.   Do not bring your home medications to the hospital. The Pharmacy will dispense medications listed on your medication list to you during your admission in the Hospital.   Please read over the following fact sheets you were given: IF YOU HAVE QUESTIONS ABOUT YOUR PRE-OP INSTRUCTIONS, PLEASE CALL  [phone].        Pre-operative 5 CHG Bath Instructions    You can play a key role in reducing the risk of infection after surgery. Your skin needs to be as free of germs as possible. You can reduce the number of germs on your skin by washing with CHG (chlorhexidine gluconate) soap before surgery. CHG is an antiseptic soap that kills germs and continues to kill germs even after washing.    DO NOT use if you have an allergy to chlorhexidine/CHG or antibacterial soaps. If your skin becomes reddened or irritated, stop using the CHG and notify one of our RNs at [phone]   Please shower with the CHG soap starting 4 days before surgery using the following schedule: START SHOWERS ON  SUNDAY  June 28, 2024                                                                                                                                                                               Please keep in mind the following:  DO  NOT shave, including legs and underarms, starting the day of your first shower.   You may shave your face at any point before/day of surgery.    Place clean sheets on your bed the day you start using CHG soap. Use a clean washcloth (not used since being washed) for each shower. DO NOT sleep with pets once you start using the CHG.    CHG Shower Instructions:  If you choose to wash your hair and private area, wash first with your normal shampoo/soap.  After you use shampoo/soap, rinse your hair and body thoroughly to remove shampoo/soap residue.  Turn the water OFF and apply about 3 tablespoons (45 ml) of CHG soap to a CLEAN washcloth.  Apply CHG soap ONLY FROM YOUR NECK DOWN TO YOUR TOES (washing for 3-5 minutes)  DO NOT use CHG soap on face, private areas, open wounds, or sores.  Pay special attention to the area where your surgery is being performed.  If you are having back surgery, having someone wash your back for you may be helpful.   Wait 2 minutes after CHG soap is  applied, then you may rinse off the CHG soap.  Pat dry with a clean towel  Put on clean clothes/pajamas   If you choose to wear lotion, please use ONLY the CHG-compatible lotions on the back of this paper.     Additional instructions for the day of surgery: DO NOT APPLY any lotions, deodorants, cologne, or perfumes.   Put on clean/comfortable clothes.  Brush your teeth.  Ask your nurse before applying any prescription medications to the skin.          CHG Compatible Lotions    Aveeno Moisturizing lotion  Cetaphil Moisturizing Cream  Cetaphil Moisturizing Lotion  Clairol Herbal Essence Moisturizing Lotion, Dry Skin  Clairol Herbal Essence Moisturizing Lotion, Extra Dry Skin  Clairol Herbal Essence Moisturizing Lotion, Normal Skin  Curel Age Defying Therapeutic Moisturizing Lotion with Alpha Hydroxy  Curel Extreme Care Body Lotion  Curel Soothing Hands Moisturizing Hand Lotion  Curel Therapeutic Moisturizing Cream, Fragrance-Free  Curel Therapeutic Moisturizing Lotion, Fragrance-Free  Curel Therapeutic Moisturizing Lotion, Original Formula  Eucerin Daily Replenishing Lotion  Eucerin Dry Skin Therapy Plus Alpha Hydroxy Crme  Eucerin Dry Skin Therapy Plus Alpha Hydroxy Lotion  Eucerin Original Crme  Eucerin Original Lotion  Eucerin Plus Crme Eucerin Plus Lotion  Eucerin TriLipid Replenishing Lotion  Keri Anti-Bacterial Hand Lotion  Keri Deep Conditioning Original Lotion Dry Skin Formula Softly Scented  Keri Deep Conditioning Original Lotion, Fragrance Free Sensitive Skin Formula  Keri Lotion Fast Absorbing Fragrance Free Sensitive Skin Formula  Keri Lotion Fast Absorbing Softly Scented Dry Skin Formula  Keri Original Lotion  Keri Skin Renewal Lotion Keri Silky Smooth Lotion  Keri Silky Smooth Sensitive Skin Lotion  Nivea Body Creamy Conditioning Oil  Nivea Body Extra Enriched Lotion  Nivea Body Original Lotion  Nivea Body Sheer Moisturizing Lotion Nivea Crme  Nivea  Skin Firming Lotion  NutraDerm 30 Skin Lotion  NutraDerm Skin Lotion  NutraDerm Therapeutic Skin Cream  NutraDerm Therapeutic Skin Lotion  ProShield Protective Hand Cream  Provon moisturizing lotion     FAILURE TO FOLLOW THESE INSTRUCTIONS MAY RESULT IN THE CANCELLATION OF YOUR SURGERY   PATIENT SIGNATURE_________________________________   NURSE SIGNATURE__________________________________   ________________________________________________________________________              Nasario Exon      An incentive spirometer is a tool that can help keep your lungs clear and active.  This tool measures how well you are filling your lungs with each breath. Taking long deep breaths may help reverse or decrease the chance of developing breathing (pulmonary) problems (especially infection) following: A long period of time when you are unable to move or be active. BEFORE THE PROCEDURE  If the spirometer includes an indicator to show your best effort, your nurse or respiratory therapist will set it to a desired goal. If possible, sit up straight or lean slightly forward. Try not to slouch. Hold the incentive spirometer in an upright position. INSTRUCTIONS FOR USE  Sit on the edge of your bed if possible, or sit up as far as you can in bed or on a chair. Hold the incentive spirometer in an upright position. Breathe out normally. Place the mouthpiece in your mouth and seal your lips tightly around it. Breathe in slowly and as deeply as possible, raising the piston or the ball toward the top of the column. Hold your breath for 3-5 seconds or for as long as possible. Allow the piston or ball to fall to the bottom of the column. Remove the mouthpiece from your mouth and breathe out normally. Rest for a few seconds and repeat Steps 1 through 7 at least 10 times every 1-2 hours when you are awake. Take your time and take a few normal breaths between deep breaths. The spirometer may include  an indicator to show your best effort. Use the indicator as a goal to work toward during each repetition. After each set of 10 deep breaths, practice coughing to be sure your lungs are clear. If you have an incision (the cut made at the time of surgery), support your incision when coughing by placing a pillow or rolled up towels firmly against it. Once you are able to get out of bed, walk around indoors and cough well. You may stop using the incentive spirometer when instructed by your caregiver.  RISKS AND COMPLICATIONS Take your time so you do not get dizzy or light-headed. If you are in pain, you may need to take or ask for pain medication before doing incentive spirometry. It is harder to take a deep breath if you are having pain. AFTER USE Rest and breathe slowly and easily. It can be helpful to keep track of a log of your progress. Your caregiver can provide you with a simple table to help with this. If you are using the spirometer at home, follow these instructions: SEEK MEDICAL CARE IF:  You are having difficultly using the spirometer. You have trouble using the spirometer as often as instructed. Your pain medication is not giving enough relief while using the spirometer. You develop fever of 100.5 F (38.1 C) or higher.                                                                                                    SEEK IMMEDIATE MEDICAL CARE IF:  You cough up bloody sputum that had not been present before. You develop fever of 102 F (38.9 C) or greater. You develop worsening pain at or near the  incision site. MAKE SURE YOU:  Understand these instructions. Will watch your condition. Will get help right away if you are not doing well or get worse. Document Released: 03/11/2007 Document Revised: 01/21/2012 Document Reviewed: 05/12/2007 [facility] Patient Information 2014 [place], [place].           If you would like to see a video about joint replacement:     [email or web address]

## 2024-06-23 ENCOUNTER — Encounter (HOSPITAL_COMMUNITY)
Admission: RE | Admit: 2024-06-23 | Discharge: 2024-06-23 | Disposition: A | Discharge status: Home / Self Care | Source: Ambulatory Visit | Attending: Orthopedic Surgery | Admitting: Orthopedic Surgery

## 2024-06-23 ENCOUNTER — Encounter (HOSPITAL_COMMUNITY): Payer: Self-pay | Admitting: Medical

## 2024-06-23 DIAGNOSIS — M1711 Unilateral primary osteoarthritis, right knee: Secondary | ICD-10-CM

## 2024-06-23 DIAGNOSIS — Z79899 Other long term (current) drug therapy: Secondary | ICD-10-CM

## 2024-06-23 DIAGNOSIS — Z01818 Encounter for other preprocedural examination: Secondary | ICD-10-CM

## 2024-06-24 ENCOUNTER — Encounter (HOSPITAL_COMMUNITY): Admission: RE | Admit: 2024-06-24 | Source: Ambulatory Visit

## 2024-06-24 NOTE — Addendum Note (Signed)
 Encounter addended by: Bryant Neville POUR, RN on: 06/24/2024 12:57 PM  Actions taken: Clinical Note Signed

## 2024-06-30 NOTE — Progress Notes (Signed)
 Spoke to patient regarding surgery on 07-02-14 and missed preop appointment. Patient stated that he is out of town due to a family emergency and is canceling surgery.  Surgeon's office has been notified.

## 2024-07-02 ENCOUNTER — Encounter (HOSPITAL_COMMUNITY): Admission: RE | Payer: Self-pay | Source: Home / Self Care

## 2024-07-02 ENCOUNTER — Ambulatory Visit (HOSPITAL_COMMUNITY): Admission: RE | Admit: 2024-07-02 | Source: Home / Self Care | Admitting: Orthopedic Surgery

## 2024-07-02 ENCOUNTER — Ambulatory Visit: Attending: Orthopedic Surgery

## 2024-07-02 SURGERY — ARTHROPLASTY, KNEE, TOTAL
Anesthesia: Spinal | Site: Knee | Laterality: Right

## 2024-07-02 NOTE — Therapy (Deleted)
 OUTPATIENT PHYSICAL THERAPY LOWER EXTREMITY EVALUATION   Patient Name: Marc May MRN: [id number] DOB:02/09/1976, 48 y.o., male Today's Date: 07/02/2024  END OF SESSION:   Past Medical History:  Diagnosis Date   Asthma    Diverticulitis    Dizziness    GERD (gastroesophageal reflux disease)    Hernia of abdominal cavity    History of blood clots    Migraines    Osteoarthritis    knees   Sleep apnea    Past Surgical History:  Procedure Laterality Date   KNEE ARTHROSCOPY  2012   UMBILICAL HERNIA REPAIR N/A 11/29/2017   Procedure: HERNIA REPAIR UMBILICAL ADULT;  Surgeon: Kinsinger, Herlene Righter, MD;  Location: [facility] OR;  Service: General;  Laterality: N/A;   Patient Active Problem List   Diagnosis Date Noted   Unilateral primary osteoarthritis, left knee 07/29/2019   Unilateral primary osteoarthritis, right knee 07/29/2019   Abnormal CT scan, colon 04/24/2019   Microcytic anemia 01/07/2019   Portal vein thrombosis 01/06/2019   Acute diverticulitis 01/06/2019   Hyponatremia 01/06/2019   OSA (obstructive sleep apnea) 01/06/2019   Umbilical hernia 11/29/2017   Strangulated umbilical hernia 11/29/2017   Morbid obesity with BMI of 40.0-44.9, adult (HCC) 02/28/2016   Gastroesophageal reflux disease without esophagitis 02/28/2016   Primary osteoarthritis of both knees 02/28/2016   Chronic constipation 02/28/2016    PCP: ***  REFERRING PROVIDER: ***  REFERRING DIAG: ***  THERAPY DIAG:  No diagnosis found.  Rationale for Evaluation and Treatment: {HABREHAB:27488}  ONSET DATE: ***  SUBJECTIVE:   SUBJECTIVE STATEMENT: ***  PERTINENT HISTORY: *** PAIN:  Are you having pain? {OPRCPAIN:27236}  PRECAUTIONS: {Therapy precautions:24002}  RED FLAGS: {PT Red Flags:29287}   WEIGHT BEARING RESTRICTIONS: {Yes ***/No:24003}  FALLS:  Has patient fallen in last 6 months? {fallsyesno:27318}  LIVING ENVIRONMENT: Lives with: {OPRC lives with:25569::lives with their  family} Lives in: {Lives in:25570} Stairs: {opstairs:27293} Has following equipment at home: {Assistive devices:23999}  OCCUPATION: ***  PLOF: {PLOF:24004}  PATIENT GOALS: ***  NEXT MD VISIT: ***  OBJECTIVE:  Note: Objective measures were completed at Evaluation unless otherwise noted.  DIAGNOSTIC FINDINGS: ***  PATIENT SURVEYS:  {rehab surveys:24030}  COGNITION: Overall cognitive status: {cognition:24006}     SENSATION: {sensation:27233}  EDEMA:  {edema:24020}  MUSCLE LENGTH: Hamstrings: Right *** deg; Left *** deg Debby test: Right *** deg; Left *** deg  POSTURE: {posture:25561}  PALPATION: ***  LOWER EXTREMITY ROM:  {AROM/PROM:27142} ROM Right eval Left eval  Hip flexion    Hip extension    Hip abduction    Hip adduction    Hip internal rotation    Hip external rotation    Knee flexion    Knee extension    Ankle dorsiflexion    Ankle plantarflexion    Ankle inversion    Ankle eversion     (Blank rows = not tested)  LOWER EXTREMITY MMT:  MMT Right eval Left eval  Hip flexion    Hip extension    Hip abduction    Hip adduction    Hip internal rotation    Hip external rotation    Knee flexion    Knee extension    Ankle dorsiflexion    Ankle plantarflexion    Ankle inversion    Ankle eversion     (Blank rows = not tested)  LOWER EXTREMITY SPECIAL TESTS:  {LEspecialtests:26242}  FUNCTIONAL TESTS:  {Functional tests:24029}  GAIT: Distance walked: *** Assistive device utilized: {Assistive devices:23999} Level of assistance: {Levels of assistance:24026} Comments: ***  TREATMENT DATE: ***    PATIENT EDUCATION:  Education details: *** Person educated: {Person educated:25204} Education method: {Education Method:25205} Education comprehension: {Education Comprehension:25206}  HOME EXERCISE  PROGRAM: ***  ASSESSMENT:  CLINICAL IMPRESSION: Patient is a *** y.o. *** who was seen today for physical therapy evaluation and treatment for ***.   OBJECTIVE IMPAIRMENTS: {opptimpairments:25111}.   ACTIVITY LIMITATIONS: {activitylimitations:27494}  PARTICIPATION LIMITATIONS: {participationrestrictions:25113}  PERSONAL FACTORS: {Personal factors:25162} are also affecting patient's functional outcome.   REHAB POTENTIAL: {rehabpotential:25112}  CLINICAL DECISION MAKING: {clinical decision making:25114}  EVALUATION COMPLEXITY: {Evaluation complexity:25115}   GOALS: Goals reviewed with patient? {yes/no:20286}  SHORT TERM GOALS: Target date: *** *** Baseline: Goal status: INITIAL  2.  *** Baseline:  Goal status: INITIAL  3.  *** Baseline:  Goal status: INITIAL  4.  *** Baseline:  Goal status: INITIAL  5.  *** Baseline:  Goal status: INITIAL  6.  *** Baseline:  Goal status: INITIAL  LONG TERM GOALS: Target date: ***  *** Baseline:  Goal status: INITIAL  2.  *** Baseline:  Goal status: INITIAL  3.  *** Baseline:  Goal status: INITIAL  4.  *** Baseline:  Goal status: INITIAL  5.  *** Baseline:  Goal status: INITIAL  6.  *** Baseline:  Goal status: INITIAL   PLAN:  PT FREQUENCY: {rehab frequency:25116}  PT DURATION: {rehab duration:25117}  PLANNED INTERVENTIONS: {rehab planned interventions:25118::97110-Therapeutic exercises,97530- Therapeutic [phone]- Neuromuscular re-education,97535- Self Rjmz,02859- Manual therapy}  PLAN FOR NEXT SESSION: ***   Debria Broecker L Carline Dura, PT 07/02/2024, 1:12 PM

## 2024-07-07 ENCOUNTER — Ambulatory Visit

## 2024-07-09 ENCOUNTER — Encounter: Admitting: Rehabilitation

## 2024-07-14 ENCOUNTER — Encounter

## 2024-07-16 ENCOUNTER — Encounter

## 2024-07-21 ENCOUNTER — Encounter

## 2024-07-23 ENCOUNTER — Encounter

## 2024-07-28 ENCOUNTER — Encounter
